# Patient Record
Sex: Male | Born: 1960
Health system: Southern US, Community
[De-identification: ages and names within clinical notes are randomized; demographics above are authoritative.]

## PROBLEM LIST (undated history)

## (undated) DIAGNOSIS — I10 Essential (primary) hypertension: Secondary | ICD-10-CM

## (undated) HISTORY — PX: VASECTOMY: SHX75

## (undated) HISTORY — DX: Essential (primary) hypertension: I10

---

## 2011-06-14 ENCOUNTER — Ambulatory Visit (INDEPENDENT_AMBULATORY_CARE_PROVIDER_SITE_OTHER): Payer: BC Managed Care – PPO | Admitting: Family Medicine

## 2011-06-14 DIAGNOSIS — I10 Essential (primary) hypertension: Secondary | ICD-10-CM

## 2011-06-14 DIAGNOSIS — E782 Mixed hyperlipidemia: Secondary | ICD-10-CM

## 2011-10-24 ENCOUNTER — Other Ambulatory Visit: Payer: Self-pay | Admitting: Family Medicine

## 2011-10-29 ENCOUNTER — Telehealth: Payer: Self-pay

## 2011-10-29 NOTE — Telephone Encounter (Signed)
PT STATES HE USUALLY TAKE 2 LISINOPRIL A DAY AND HE IS OUT, THE PHARMACY HAD TRIED GETTING A REFILL FOR IT AND WAS DENIED, HE DOESN'T UNDERSTAND WHY SINCE HE SHOULD HAVE HAD ENOUGH UNTIL June. PLEASE  CALL [phone]    [facility] [address]

## 2011-10-30 MED ORDER — LISINOPRIL-HYDROCHLOROTHIAZIDE 20-12.5 MG PO TABS: 2.0000 | ORAL_TABLET | Freq: Every day | ORAL | Status: DC

## 2011-10-30 NOTE — Telephone Encounter (Signed)
LMOM THAT RX WAS SENT IN FOR 2 A DAY AND TO FOLLOW UP IN [place]

## 2011-10-30 NOTE — Telephone Encounter (Signed)
The Rx was wrote to take Lisiniopril 1 QD, the message was to be take 2 QD and he needs a refill for 1 month and then to follow-up in June.

## 2011-11-18 ENCOUNTER — Telehealth: Payer: Self-pay

## 2011-11-18 NOTE — Telephone Encounter (Signed)
PT STATES HE CANNOT GET HIS BP MEDS AFTER CALLING PHARMACY AND THE PHARMACY CONTACTING us??   BEST PHONE [phone] [facility] [address].

## 2011-11-19 ENCOUNTER — Telehealth: Payer: Self-pay

## 2011-11-19 MED ORDER — LISINOPRIL-HYDROCHLOROTHIAZIDE 20-12.5 MG PO TABS: 2.0000 | ORAL_TABLET | Freq: Every day | ORAL | Status: DC

## 2011-11-19 NOTE — Telephone Encounter (Signed)
Pt states he did not receive 60 pills on may 15,  He is completely out and needs these today.

## 2011-11-19 NOTE — Telephone Encounter (Signed)
15 day supply rx'd today, LMOM advising patient that OV will be needed for more.

## 2011-11-20 ENCOUNTER — Telehealth: Payer: Self-pay

## 2011-11-20 NOTE — Telephone Encounter (Signed)
Patient states he is on his way to pharmacy to get Lisinopril. He has been out of BP meds for 5 days. He is mad because we refilled med until June 15th, but needs it refilled until June 28th. He has appt with Dr. Neva Seat on 6/28, which is 6 months to the day of his last appointment. Pt has to keep paying for refills, which is really starting to add up, when it shouldn't have to.  Note: Previous message from Steward Drone was not routed to any pool.

## 2011-11-20 NOTE — Telephone Encounter (Signed)
Pt notified that 1 month of rx was sent in

## 2011-11-20 NOTE — Telephone Encounter (Signed)
Patient called and voiced concerns that has transpired over receiving his blood pressure medication refills. His original prescription in December 2012 was for 1 q day of Lisinopril/HCTZ 20/12.5 when it should have been 2 q day. This in turn ran the patient short before his follow up visit for June 2013. His calls to the pharmacy and calls to the office were numerous and several mistakes occurred along the way. Mr Bostic finally received his medication as of this morning. He was given our sincere regrets over what has happened over this issue and how to proceed in the event he has these problems ever again. Patient was agreeable with the plan to ask to speak with a manager or myself. Will follow up with Dr Neva Seat on December 13 2011.

## 2011-12-13 ENCOUNTER — Ambulatory Visit (INDEPENDENT_AMBULATORY_CARE_PROVIDER_SITE_OTHER): Payer: PRIVATE HEALTH INSURANCE | Admitting: Family Medicine

## 2011-12-13 ENCOUNTER — Encounter: Payer: Self-pay | Admitting: Family Medicine

## 2011-12-13 VITALS — BP 149/85 | HR 73 | Temp 97.2°F | Resp 16 | Ht 71.25 in | Wt 223.0 lb

## 2011-12-13 DIAGNOSIS — E785 Hyperlipidemia, unspecified: Secondary | ICD-10-CM

## 2011-12-13 DIAGNOSIS — F17200 Nicotine dependence, unspecified, uncomplicated: Secondary | ICD-10-CM

## 2011-12-13 DIAGNOSIS — I10 Essential (primary) hypertension: Secondary | ICD-10-CM

## 2011-12-13 MED ORDER — LISINOPRIL-HYDROCHLOROTHIAZIDE 20-12.5 MG PO TABS: 2.0000 | ORAL_TABLET | Freq: Every day | ORAL | Status: DC

## 2011-12-13 MED ORDER — AMLODIPINE BESYLATE 2.5 MG PO TABS: 2.5000 mg | ORAL_TABLET | Freq: Every day | ORAL | Status: DC

## 2011-12-13 NOTE — Progress Notes (Signed)
  Subjective:    Patient ID: Lavalle Skoda, male    DOB: 03-18-1961, 51 y.o.   MRN: [id number]  HPI Reshard Guillet is a 51 y.o. male HTN-  Recently on meds, but had been out for a few days.  Phone notes reviewed regarding medication difficulties form December.  Should be on (2) lisinopril/hct 20/12.5mg  QD, and norvasc 2.5mg  QD. Working on weight - down 5 pounds since last office visit.  Alcohol 2 days per week. Still smoking but cut back to 1 pack per day.   Outside Bp's 130's/70's. Sometimes 120.   Hyperlipidemia - last lipids here in 2010. Ate lunch 40 minutes ago.  Fish oil and MVI QD.   Review of Systems  Constitutional: Negative for fatigue and unexpected weight change.  Eyes: Negative for visual disturbance.  Respiratory: Negative for cough, chest tightness and shortness of breath.   Cardiovascular: Negative for chest pain, palpitations and leg swelling.  Gastrointestinal: Negative for abdominal pain and blood in stool.  Musculoskeletal: Negative for myalgias.  Neurological: Negative for dizziness, light-headedness and headaches.       Objective:   Physical Exam  Constitutional: He is oriented to person, place, and time. He appears well-developed and well-nourished.  HENT:  Head: Normocephalic and atraumatic.  Eyes: EOM are normal. Pupils are equal, round, and reactive to light.  Neck: No JVD present. Carotid bruit is not present.  Cardiovascular: Normal rate, regular rhythm and normal heart sounds.   No murmur heard. Pulmonary/Chest: Effort normal and breath sounds normal. He has no rales.  Musculoskeletal: He exhibits no edema.  Neurological: He is alert and oriented to person, place, and time.  Skin: Skin is warm and dry.  Psychiatric: He has a normal mood and affect. His behavior is normal.          Assessment & Plan:  Dujuan Stankowski is a 51 y.o. male  HTN - borderline but home BP's ok. Continue to work on weight loss and recheck in 6 months.   Hyperlipidemia -  not fasting today - plan on fasting labs in 3 days.  Lab visit only.  Cont fish oil, consider low dose statin - discussed possible side effects, and rtc precautions, but with tobacco use (cessation discussed again, and congratulated on decrease in amount) and underlying htn, at risk for CAD/CVD.  Goal LDL under 100.   Patient checking on physical cost with new insurance - encouraged to schedule physical or office visit in next 6 months.

## 2012-06-12 ENCOUNTER — Ambulatory Visit: Payer: PRIVATE HEALTH INSURANCE | Admitting: Family Medicine

## 2012-06-15 ENCOUNTER — Ambulatory Visit: Payer: PRIVATE HEALTH INSURANCE | Admitting: Family Medicine

## 2012-07-13 ENCOUNTER — Other Ambulatory Visit: Payer: Self-pay | Admitting: Family Medicine

## 2012-07-13 NOTE — Telephone Encounter (Signed)
Needs office visit.

## 2012-07-20 ENCOUNTER — Encounter: Payer: PRIVATE HEALTH INSURANCE | Admitting: Family Medicine

## 2012-07-20 NOTE — Progress Notes (Signed)
  Subjective:    Patient ID: Jonathan Bush, male    DOB: July 14, 1960, 52 y.o.   MRN: [id number]  HPI HTN - borderline last ov, but home BP's ok (Outside Bp's 130's/70's). Continued on (2) lisinopril/hct 20/12.5mg  QD, and norvasc 2.5mg  QD. Advised to continue to work on weight loss and recheck in 6 months.   Hyperlipidemia - see last ov.  Plan for fasting labs with order, but not have these done.    Review of Systems     Objective:   Physical Exam        Assessment & Plan:   This encounter was created in error - please disregard.

## 2012-08-17 ENCOUNTER — Ambulatory Visit (INDEPENDENT_AMBULATORY_CARE_PROVIDER_SITE_OTHER): Payer: PRIVATE HEALTH INSURANCE | Admitting: Family Medicine

## 2012-08-17 ENCOUNTER — Encounter: Payer: Self-pay | Admitting: Family Medicine

## 2012-08-17 VITALS — BP 142/68 | HR 68 | Temp 97.8°F | Resp 16 | Ht 72.0 in | Wt 225.2 lb

## 2012-08-17 DIAGNOSIS — Z720 Tobacco use: Secondary | ICD-10-CM

## 2012-08-17 DIAGNOSIS — E785 Hyperlipidemia, unspecified: Secondary | ICD-10-CM

## 2012-08-17 DIAGNOSIS — F17200 Nicotine dependence, unspecified, uncomplicated: Secondary | ICD-10-CM

## 2012-08-17 DIAGNOSIS — I10 Essential (primary) hypertension: Secondary | ICD-10-CM

## 2012-08-17 MED ORDER — AMLODIPINE BESYLATE 2.5 MG PO TABS: ORAL_TABLET | ORAL | Status: DC

## 2012-08-17 MED ORDER — LISINOPRIL-HYDROCHLOROTHIAZIDE 20-12.5 MG PO TABS: ORAL_TABLET | ORAL | Status: DC

## 2012-08-17 NOTE — Progress Notes (Signed)
Subjective:    Patient ID: Jonathan Bush, male    DOB: 04-Feb-1961, 52 y.o.   MRN: [id number]  HPI Jonathan Bush is a 52 y.o. male  Last office visit June 2013.  Plan at that time was follow up for ov or CPE in 6 months.   HTN - borderline but home BP's ok. Continue to work on weight loss and recheck in 6 months. Outside blood pressures - 130/70's.   Hyperlipidemia - not fasting last ov.  - planned on fasting labs in 3 days at that time.  tobacco use, and underlying htn, at risk for CAD/CVD.  Goal LDL under 100. Taking fish oil every day. Had fasting blood work drawn this am.  Tobacco use - still at 1 ppd, thinking of quitting, planning on getting weight down first.  Etoh: 2 days per week - 6-8 beers at a time.   Cost prohibitive to schedule physical.   Father deceased at 71yo  New dx of lung cancer - smoker.  no known fh of early CAD.    Review of Systems  Constitutional: Negative for fatigue and unexpected weight change.  Eyes: Negative for visual disturbance.  Respiratory: Negative for cough, chest tightness and shortness of breath.   Cardiovascular: Negative for chest pain, palpitations and leg swelling.  Gastrointestinal: Negative for abdominal pain and blood in stool.  Neurological: Negative for dizziness, light-headedness and headaches.      Objective:   Physical Exam  Vitals reviewed. Constitutional: He is oriented to person, place, and time. He appears well-developed and well-nourished.  HENT:  Head: Normocephalic and atraumatic.  Eyes: EOM are normal. Pupils are equal, round, and reactive to light.  Neck: No JVD present. Carotid bruit is not present.  Cardiovascular: Normal rate, regular rhythm and normal heart sounds.   No murmur heard. Pulmonary/Chest: Effort normal and breath sounds normal. He has no rales.  Musculoskeletal: He exhibits no edema.  Neurological: He is alert and oriented to person, place, and time.  Skin: Skin is warm and dry.  Psychiatric: He has  a normal mood and affect.       Assessment & Plan:  Jonathan Bush is a 52 y.o. male HTN (hypertension) - Plan: amLODipine (NORVASC) 2.5 MG tablet, lisinopril-hydrochlorothiazide (PRINZIDE,ZESTORETIC) 20-12.5 MG per tablet, Comprehensive metabolic panel.  Continue same doses meds for now as reported better control out of office, but bring meter/outside readings to next ov. Also discussed alcohol use, and concern of 6-8 beer consumption, even if only 2 days per week.  Other and unspecified hyperlipidemia - Plan: Lipid panel.  May need statin.  Discussed his concerns with this medicine, but also concerns for CAD given rf's of tobacco use, age, and HTN.  Tobacco abuse - contemplative stage of cessation.  Discussed benefits of cessation and continued risks of tobacco abuse, especially in light of diagnosis of lung CA in father prior to his passing.  rtc if any new or worsening cough, understanding expressed.   Discussed scheduling CPE, but cost prohibitive at this point. Some anticipatory guidance as above.   Patient Instructions  Your should receive a call or letter about your lab results within the next week to 10 days.  Recheck in 6 months depending on labs.   If you need assistance with quitting smoking, let us know.  Return to the clinic or go to the nearest emergency room if any of your symptoms worsen or new symptoms occur.    Meds ordered this encounter  Medications  . amLODipine (  NORVASC) 2.5 MG tablet    Sig: take 1 tablet by mouth once daily    Dispense:  90 tablet    Refill:  1  . lisinopril-hydrochlorothiazide (PRINZIDE,ZESTORETIC) 20-12.5 MG per tablet    Sig: take 2 tablets by mouth daily    Dispense:  180 tablet    Refill:  1

## 2012-08-17 NOTE — Patient Instructions (Addendum)
Your should receive a call or letter about your lab results within the next week to 10 days.  Recheck in 6 months depending on labs.   If you need assistance with quitting smoking, let us know.  Return to the clinic or go to the nearest emergency room if any of your symptoms worsen or new symptoms occur.

## 2012-08-18 LAB — COMPREHENSIVE METABOLIC PANEL
AST: 17 U/L (ref 0–37)
Albumin: 4.1 g/dL (ref 3.5–5.2)
BUN: 15 mg/dL (ref 6–23)
CO2: 27 mEq/L (ref 19–32)
Calcium: 9.2 mg/dL (ref 8.4–10.5)
Chloride: 104 mEq/L (ref 96–112)
Creat: 0.92 mg/dL (ref 0.50–1.35)
Glucose, Bld: 113 mg/dL — ABNORMAL HIGH (ref 70–99)
Potassium: 4.2 mEq/L (ref 3.5–5.3)

## 2012-08-18 LAB — LIPID PANEL
Cholesterol: 184 mg/dL (ref 0–200)
HDL: 30 mg/dL — ABNORMAL LOW (ref >39)
Triglycerides: 252 mg/dL — ABNORMAL HIGH (ref <150)

## 2013-02-22 ENCOUNTER — Ambulatory Visit: Payer: PRIVATE HEALTH INSURANCE | Admitting: Family Medicine

## 2013-03-13 ENCOUNTER — Other Ambulatory Visit: Payer: Self-pay | Admitting: Family Medicine

## 2013-03-22 ENCOUNTER — Ambulatory Visit: Payer: PRIVATE HEALTH INSURANCE | Admitting: Family Medicine

## 2013-04-19 ENCOUNTER — Ambulatory Visit: Payer: PRIVATE HEALTH INSURANCE | Admitting: Family Medicine

## 2013-05-17 ENCOUNTER — Ambulatory Visit: Payer: PRIVATE HEALTH INSURANCE | Admitting: Family Medicine

## 2013-09-29 ENCOUNTER — Telehealth: Payer: Self-pay

## 2013-09-29 MED ORDER — LISINOPRIL-HYDROCHLOROTHIAZIDE 20-12.5 MG PO TABS: 2.0000 | ORAL_TABLET | Freq: Every day | ORAL | Status: DC

## 2013-09-29 NOTE — Telephone Encounter (Signed)
Done . Pt notified.  

## 2013-09-29 NOTE — Telephone Encounter (Signed)
Pt would like a rx refill on his lisinopril, he would also like to add that he was unable to get an appt until June 15 with Dr.Greene. Best# [phone]

## 2013-10-04 ENCOUNTER — Other Ambulatory Visit: Payer: Self-pay | Admitting: Physician Assistant

## 2013-11-29 ENCOUNTER — Ambulatory Visit: Payer: PRIVATE HEALTH INSURANCE | Admitting: Family Medicine

## 2013-12-05 ENCOUNTER — Other Ambulatory Visit: Payer: Self-pay | Admitting: Family Medicine

## 2014-02-12 ENCOUNTER — Other Ambulatory Visit: Payer: Self-pay | Admitting: Physician Assistant

## 2014-04-11 ENCOUNTER — Other Ambulatory Visit: Payer: Self-pay | Admitting: Family Medicine

## 2014-04-15 ENCOUNTER — Ambulatory Visit (INDEPENDENT_AMBULATORY_CARE_PROVIDER_SITE_OTHER): Payer: PRIVATE HEALTH INSURANCE | Admitting: Family Medicine

## 2014-04-15 VITALS — BP 128/68 | HR 81 | Temp 97.9°F | Resp 16 | Ht 73.0 in | Wt 223.0 lb

## 2014-04-15 DIAGNOSIS — I10 Essential (primary) hypertension: Secondary | ICD-10-CM

## 2014-04-15 DIAGNOSIS — E785 Hyperlipidemia, unspecified: Secondary | ICD-10-CM

## 2014-04-15 DIAGNOSIS — R739 Hyperglycemia, unspecified: Secondary | ICD-10-CM

## 2014-04-15 LAB — COMPREHENSIVE METABOLIC PANEL
ALK PHOS: 75 U/L (ref 39–117)
ALT: 15 U/L (ref 0–53)
AST: 23 U/L (ref 0–37)
Albumin: 4.4 g/dL (ref 3.5–5.2)
BILIRUBIN TOTAL: 0.5 mg/dL (ref 0.2–1.2)
BUN: 10 mg/dL (ref 6–23)
CO2: 29 mEq/L (ref 19–32)
CREATININE: 0.91 mg/dL (ref 0.50–1.35)
Calcium: 9.7 mg/dL (ref 8.4–10.5)
Chloride: 102 mEq/L (ref 96–112)
GLUCOSE: 104 mg/dL — AB (ref 70–99)
Potassium: 4.5 mEq/L (ref 3.5–5.3)
SODIUM: 138 meq/L (ref 135–145)
TOTAL PROTEIN: 7.3 g/dL (ref 6.0–8.3)

## 2014-04-15 LAB — LIPID PANEL
CHOL/HDL RATIO: 6.2 ratio
Cholesterol: 185 mg/dL (ref 0–200)
HDL: 30 mg/dL — AB (ref >39)
LDL CALC: 128 mg/dL — AB (ref 0–99)
TRIGLYCERIDES: 136 mg/dL (ref <150)
VLDL: 27 mg/dL (ref 0–40)

## 2014-04-15 LAB — POCT GLYCOSYLATED HEMOGLOBIN (HGB A1C): Hemoglobin A1C: 4.7

## 2014-04-15 LAB — GLUCOSE, POCT (MANUAL RESULT ENTRY): POC Glucose: 104 mg/dl — AB (ref 70–99)

## 2014-04-15 MED ORDER — AMLODIPINE BESYLATE 2.5 MG PO TABS: 2.5000 mg | ORAL_TABLET | Freq: Every day | ORAL | Status: DC

## 2014-04-15 MED ORDER — LISINOPRIL-HYDROCHLOROTHIAZIDE 20-12.5 MG PO TABS: 2.0000 | ORAL_TABLET | Freq: Every day | ORAL | Status: DC

## 2014-04-15 NOTE — Progress Notes (Addendum)
Subjective:    Patient ID: Jonathan Bush, male    DOB: 07-31-1960, 53 y.o.   MRN: [id number]  This chart was scribed for Meredith StaggersJeffrey Rosemary Pentecost, MD by Tonye RoyaltyJoshua Chen, ED Scribe. This patient was seen in room 3 and the patient's care was started at 9:25 AM.   Chief Complaint  Patient presents with  . Medication Refill    HPI  HPI Comments: Jonathan RiggMichael Freeburg is a 53 y.o. male who presents to the [facility] for medication refill.   Hypertension: His last visit with me was in March of 2014. He did have 4 canceled appointments and 1 no-show since that time. He has been given refills of medication recently with notes that office visit was needed for additional refills. At last visit, weight was 225 lb and BP 142/68. Continued same doses of medications and weight loss along with decreasing alcohol has had times when he would drink 6-8 beers at one time, but only a few days per week. He states he has been checking his blood pressure at home and has been getting 1302/70s, it measures here at 126/68. He denies chest pain, lightheadedness, or dizziness. He also denies side effects from medication and states he is compliant. He states he has never taken ASA every day.  Hyperlipidemia: LDL 104, Lipids slightly elevated at last visit.  Lab Results  Component Value Date   CHOL 184 08/17/2012   HDL 30* 08/17/2012   LDLCALC 104* 08/17/2012   TRIG 252* 08/17/2012   CHOLHDL 6.1 08/17/2012  Recommended weight loss and recheck in 6 months. Discussed Statin at last visit, especially with his risk factors, but he declined at that time. Still taking fish oil OTC.   Tobacco Abuse: Counseled him on cessation multiple times in the past. He has roughly a 30 year history. He was in the contemplative stage prior; his father was diagnosed with lung cancer, and he was deceased at 373. He states he quit for almost 4 months during the winter but started back up due to stress. He states he has cut back some and is going to try  quitting again in 3 days. He states he just quit cold [place] last time and is planning to do the same this time. He states he has friends who have used Chantix, discussed with him this option.  Hyperglycemia: His blood sugar was 113 in March of 2013. Weight was 225 at that visit, he is 223 today. He denies increased thirst, nausea, or increased urination. He states he is fasting since 2000 last night.  Health Maintenance: Has not had colonoscopy. Plans on deferring prostate screening until physical as well.   There are no active problems to display for this patient.  Past Medical History  Diagnosis Date  . Hypertension    Past Surgical History  Procedure Laterality Date  . Vasectomy     No Known Allergies Prior to Admission medications   Medication Sig Start Date End Date Taking? Authorizing Provider  amLODipine (NORVASC) 2.5 MG tablet TAKE 1 TABLET BY MOUTH ONCE DAILY.   2ND NOTICE OFFICE VISIT NEEDED FOR ADDITIONAL REFILLS" 04/11/14  Yes Chelle S Jeffery, PA-C  fish oil-omega-3 fatty acids 1000 MG capsule Take 2 g by mouth daily.   Yes Historical Provider, MD  lisinopril-hydrochlorothiazide (PRINZIDE,ZESTORETIC) 20-12.5 MG per tablet Take 2 tablets by mouth daily. PATIENT NEEDS AN OFFICE VISIT FOR ADDITIONAL REFILLS. 02/13/14  Yes Shade FloodJeffrey R Tilak Oakley, MD  Multiple Vitamin (MULTIVITAMIN) tablet Take 1 tablet by  mouth daily.   Yes Historical Provider, MD   History   Social History  . Marital Status: Unknown    Spouse Name: N/A    Number of Children: N/A  . Years of Education: N/A   Occupational History  . Sales    Social History Main Topics  . Smoking status: Current Every Day Smoker -- 1.00 packs/day for 30 years    Types: Cigarettes  . Smokeless tobacco: Not on file  . Alcohol Use: Yes     Comment: beer  . Drug Use: No  . Sexual Activity: Yes    Partners: Female   Other Topics Concern  . Not on file   Social History Narrative   Patient walks for exercise.    Review  of Systems  Cardiovascular: Negative for chest pain.  Gastrointestinal: Negative for nausea.  Endocrine: Negative for polydipsia and polyuria.  Neurological: Negative for dizziness and light-headedness.       Objective:   Physical Exam  Nursing note and vitals reviewed. Constitutional: He is oriented to person, place, and time. He appears well-developed and well-nourished.  HENT:  Head: Normocephalic and atraumatic.  Eyes: Conjunctivae are normal.  Neck: Normal range of motion. Neck supple.  Cardiovascular: Normal rate, regular rhythm and normal heart sounds.   No murmur heard. Pulmonary/Chest: Effort normal and breath sounds normal. No respiratory distress. He has no wheezes. He has no rales.  Abdominal: Soft. Bowel sounds are normal. He exhibits no distension. There is no tenderness. There is no rebound and no guarding.  Musculoskeletal: Normal range of motion.  Neurological: He is alert and oriented to person, place, and time.  Skin: Skin is warm and dry.  Psychiatric: He has a normal mood and affect.    Filed Vitals:   04/15/14 0830  BP: 128/68  Pulse: 81  Temp: 97.9 F (36.6 C)  TempSrc: Oral  Resp: 16  Height: 6\' 1"  (1.854 m)  Weight: 223 lb (101.152 kg)  SpO2: 96%   Results for orders placed in visit on 04/15/14  GLUCOSE, POCT (MANUAL RESULT ENTRY)      Result Value Ref Range   POC Glucose 104 (*) 70 - 99 mg/dl  POCT GLYCOSYLATED HEMOGLOBIN (HGB A1C)      Result Value Ref Range   Hemoglobin A1C 4.7         Assessment & Plan:  Jonathan RiggMichael Ardolino is a 53 y.o. male Essential hypertension - Plan: Comprehensive metabolic panel  -controlled. No med changes. Wt loss, diet discussed.   Hyperlipidemia - Plan: Comprehensive metabolic panel, Lipid panel  -10 year ASCVD risk based on prior lipid panel and today's BP was 14%.  Recommended low dose statin trial, but can wait on current lipid panel to further decide. If quits smoking - risk drops to 6.7%.  Hyperglycemia -  Plan: POCT glucose (manual entry), POCT glycosylated hemoglobin (Hb A1C), Comprehensive metabolic panel  - A1C ok. Wt loss, diet and exercise discussed.  Health maintenance  -declined flu vaccine, declined referral for colonoscopy or prostate cancer testing today. Plans on follow up for physical to discuss these in next 6 months.   Tobacco abuse - plans on quit attempt again next week - resources discussed if needed.  No orders of the defined types were placed in this encounter.   Patient Instructions  [place] offers smoking cessation clinics. Registration is required. To register call [phone] or register online at [email or web address]. Let me know if you need assistance with this or medication like Chantix.  You should receive a call or letter about your lab results within the next week to 10 days.  Work on diet, exercise for weight loss. Physical in next 6 months.  Return to the clinic or go to the nearest emergency room if any of your symptoms worsen or new symptoms occur.   I personally performed the services described in this documentation, which was scribed in my presence. The recorded information has been reviewed and considered, and addended by me as needed.

## 2014-04-15 NOTE — Patient Instructions (Addendum)
[place] offers smoking cessation clinics. Registration is required. To register call [phone] or register online at [email or web address]. Let me know if you need assistance with this or medication like Chantix. You should receive a call or letter about your lab results within the next week to 10 days.  Work on diet, exercise for weight loss. Physical in next 6 months.  Return to the clinic or go to the nearest emergency room if any of your symptoms worsen or new symptoms occur.

## 2014-04-19 ENCOUNTER — Telehealth: Payer: Self-pay | Admitting: Family Medicine

## 2014-04-19 NOTE — Telephone Encounter (Signed)
Pt will call back in about 3 months to set up for CPE appt with Dr. Neva SeatGreene due to scheduling issues with his job.

## 2014-04-19 NOTE — Telephone Encounter (Signed)
-----   Message from Shade FloodJeffrey R Greene, MD sent at 04/15/2014  9:42 AM EDT ----- Physical with Neva SeatGreene in 6 months.

## 2014-04-27 ENCOUNTER — Telehealth: Payer: Self-pay | Admitting: *Deleted

## 2014-04-27 DIAGNOSIS — E785 Hyperlipidemia, unspecified: Secondary | ICD-10-CM

## 2014-04-27 NOTE — Telephone Encounter (Signed)
Pt would like to try a statin.

## 2014-04-27 NOTE — Telephone Encounter (Signed)
-----   Message from Shade FloodJeffrey R Greene, MD sent at 04/27/2014 12:12 AM EST ----- Call patient.  Blood sugar still borderline high, but 3 month test ok last visit. Other electrolytes in normal range. His cholesterol is still elevated and somewhat higher than last check. I would recommend low dose statin trial - let me know if he is ok with this, and I will send one in. Let me know if he has questions about this.

## 2014-04-28 MED ORDER — PRAVASTATIN SODIUM 20 MG PO TABS: 20.0000 mg | ORAL_TABLET | Freq: Every day | ORAL | Status: DC

## 2014-04-28 NOTE — Telephone Encounter (Signed)
Sent in pravachol Rx, low dose. Recheck in next 6-8 weeks for repeat CMP to make sure LFT's are ok and lipids. If any new myalgias, or other side effects before then - RTC.

## 2014-04-28 NOTE — Telephone Encounter (Signed)
Pt advised. He understands to schedule an appt or come to the walk in clinic in 6-8 weeks.

## 2014-10-20 ENCOUNTER — Other Ambulatory Visit: Payer: Self-pay | Admitting: Family Medicine

## 2014-11-25 ENCOUNTER — Other Ambulatory Visit: Payer: Self-pay | Admitting: Family Medicine

## 2014-12-05 ENCOUNTER — Other Ambulatory Visit: Payer: Self-pay | Admitting: Physician Assistant

## 2015-01-13 ENCOUNTER — Other Ambulatory Visit: Payer: Self-pay | Admitting: Physician Assistant

## 2015-01-13 NOTE — Telephone Encounter (Signed)
Patient has an appointment with Dr. Neva Seat on August 22nd and is request a short term supply of lisinopril to last until his appointment. Pharmacy is [facility] on [address] He also wanted to let Dr. Neva Seat know that his medication are out of wack because he has two months of the dieretic left and is almost out of lisinopril

## 2015-01-14 NOTE — Telephone Encounter (Signed)
Notified pt. 

## 2015-02-06 ENCOUNTER — Encounter: Payer: Self-pay | Admitting: Family Medicine

## 2015-02-06 ENCOUNTER — Ambulatory Visit (INDEPENDENT_AMBULATORY_CARE_PROVIDER_SITE_OTHER): Payer: PRIVATE HEALTH INSURANCE | Admitting: Family Medicine

## 2015-02-06 VITALS — BP 160/84 | HR 74 | Temp 98.4°F | Resp 16 | Ht 73.0 in | Wt 227.4 lb

## 2015-02-06 DIAGNOSIS — E785 Hyperlipidemia, unspecified: Secondary | ICD-10-CM | POA: Diagnosis not present

## 2015-02-06 DIAGNOSIS — R739 Hyperglycemia, unspecified: Secondary | ICD-10-CM

## 2015-02-06 DIAGNOSIS — I10 Essential (primary) hypertension: Secondary | ICD-10-CM

## 2015-02-06 DIAGNOSIS — Z720 Tobacco use: Secondary | ICD-10-CM | POA: Diagnosis not present

## 2015-02-06 LAB — COMPLETE METABOLIC PANEL WITH GFR
ALT: 13 U/L (ref 9–46)
AST: 21 U/L (ref 10–35)
Albumin: 4.1 g/dL (ref 3.6–5.1)
Alkaline Phosphatase: 62 U/L (ref 40–115)
BUN: 18 mg/dL (ref 7–25)
CO2: 26 mmol/L (ref 20–31)
Calcium: 9 mg/dL (ref 8.6–10.3)
Chloride: 106 mmol/L (ref 98–110)
Creat: 0.87 mg/dL (ref 0.70–1.33)
GFR, Est African American: >89 mL/min (ref >=60)
GFR, Est Non African American: >89 mL/min (ref >=60)
GLUCOSE: 114 mg/dL — AB (ref 65–99)
POTASSIUM: 4.4 mmol/L (ref 3.5–5.3)
SODIUM: 138 mmol/L (ref 135–146)
Total Bilirubin: 0.3 mg/dL (ref 0.2–1.2)
Total Protein: 7 g/dL (ref 6.1–8.1)

## 2015-02-06 LAB — LIPID PANEL
Cholesterol: 188 mg/dL (ref 125–200)
HDL: 24 mg/dL — AB (ref >=40)
LDL CALC: 100 mg/dL (ref <130)
TRIGLYCERIDES: 319 mg/dL — AB (ref <150)
Total CHOL/HDL Ratio: 7.8 Ratio — ABNORMAL HIGH (ref <=5.0)
VLDL: 64 mg/dL — AB (ref <30)

## 2015-02-06 LAB — HEMOGLOBIN A1C
Hgb A1c MFr Bld: 5.5 % (ref <5.7)
Mean Plasma Glucose: 111 mg/dL (ref <117)

## 2015-02-06 MED ORDER — LISINOPRIL-HYDROCHLOROTHIAZIDE 20-12.5 MG PO TABS: ORAL_TABLET | ORAL | Status: DC

## 2015-02-06 MED ORDER — AMLODIPINE BESYLATE 2.5 MG PO TABS: 2.5000 mg | ORAL_TABLET | Freq: Every day | ORAL | Status: DC

## 2015-02-06 NOTE — Patient Instructions (Addendum)
You should receive a call or letter about your lab results within the next week to 10 days. If cholesterol elevated or blood sugar elevated - we can talk about options.  [place] offers smoking cessation clinics. Registration is required. To register call [phone] or register online at [email or web address]. Call me if you would like to try Chantix.   Plan on physical in 6 months.

## 2015-02-06 NOTE — Progress Notes (Signed)
Subjective:  This chart was scribed for Jonathan Staggers, MD by Jonathan Bush, ED Scribe. This patient was seen in room 25 and the patient's care was started at 4:52 PM.  Patient ID: Jonathan Bush, male    DOB: November 17, 1960, 54 y.o.   MRN: [id number]  HPI   Chief Complaint  Patient presents with  . Medication Refill   HPI Comments: Jonathan Bush is a 54 y.o. male who presents to the [facility] for a follow up   Hypertension Last visit with me was 03/2014. States he checks BP outside of office which run 130/60-70's. He denies CP, difficulty breathing, light headedness and dizziness.   Lab Results  Component Value Date   CREATININE 0.91 04/15/2014   Hyperglycemia Reccommended 3 month follow up. Had normal a1c of 4.7.  Hyperlipidemia  Lab Results  Component Value Date   CHOL 185 04/15/2014   HDL 30* 04/15/2014   LDLCALC 128* 04/15/2014   TRIG 136 04/15/2014   CHOLHDL 6.2 04/15/2014   His is on omega 3 fatty acid. Pt never started pravastatin but is will to start medication.   Wt Readings from Last 3 Encounters:  02/06/15 227 lb 6.4 oz (103.148 kg)  04/15/14 223 lb (101.152 kg)  08/17/12 225 lb 3.2 oz (102.15 kg)   Pt has not been exercsises but plans to start walking when its start walking when the weather cools down.  Dental pain Pt reports he has dental pain. He was evaluated by dentist 2 weeks ago and was   Tobacco Abuse  Pt smokes about pack a day. Pt plans to quit, cold [place], after returning from [facility] in January. He was given a prescriptions of Chantix in the past but no loner has prescription.  There are no active problems to display for this patient.  Past Medical History  Diagnosis Date  . Hypertension    Past Surgical History  Procedure Laterality Date  . Vasectomy     No Known Allergies Prior to Admission medications   Medication Sig Start Date End Date Taking? Authorizing Provider  amLODipine (NORVASC) 2.5 MG tablet take 1  tablet by mouth once daily 11/25/14  Yes Jonathan Riddle, PA-C  fish oil-omega-3 fatty acids 1000 MG capsule Take 2 g by mouth daily.   Yes Historical Provider, MD  lisinopril-hydrochlorothiazide (PRINZIDE,ZESTORETIC) 20-12.5 MG per tablet TAKE 2 TABLETS DAILY 01/14/15  Yes Jonathan Jeffery, PA-C  Multiple Vitamin (MULTIVITAMIN) tablet Take 1 tablet by mouth daily.   Yes Historical Provider, MD   Social History   Social History  . Marital Status: Unknown    Spouse Name: N/A  . Number of Children: N/A  . Years of Education: N/A   Occupational History  . Sales    Social History Main Topics  . Smoking status: Current Every Day Smoker -- 1.00 packs/day for 30 years    Types: Cigarettes  . Smokeless tobacco: Not on file  . Alcohol Use: Yes     Comment: beer  . Drug Use: No  . Sexual Activity:    Partners: Female   Other Topics Concern  . Not on file   Social History Narrative   Patient walks for exercise.   Review of Systems  Constitutional: Negative for fatigue and unexpected weight change.  Eyes: Negative for visual disturbance.  Respiratory: Negative for cough, chest tightness and shortness of breath.   Cardiovascular: Negative for chest pain, palpitations and leg swelling.  Gastrointestinal: Negative for abdominal pain and  blood in stool.  Neurological: Negative for dizziness, light-headedness and headaches.   Objective:   Physical Exam  Constitutional: He is oriented to person, place, and time. He appears well-developed and well-nourished.  HENT:  Head: Normocephalic and atraumatic.  Eyes: EOM are normal. Pupils are equal, round, and reactive to light.  Neck: No JVD present. Carotid bruit is not present.  Cardiovascular: Normal rate, regular rhythm and normal heart sounds.   No murmur heard. Pulmonary/Chest: Effort normal and breath sounds normal. He has no rales.  Musculoskeletal: He exhibits no edema.  Neurological: He is alert and oriented to person, place, and time.    Skin: Skin is warm and dry.  Psychiatric: He has a normal mood and affect.  Vitals reviewed.  Filed Vitals:   02/06/15 1622  BP: 160/84  Pulse: 74  Temp: 98.4 F (36.9 C)  TempSrc: Oral  Resp: 16  Height: 6\' 1"  (1.854 m)  Weight: 227 lb 6.4 oz (103.148 kg)  SpO2: 97%   Assessment & Plan:   Jonathan Bush is a 54 y.o. male Hyperlipidemia - Plan: Lipid panel  -Up panel pending. Only on over-the-counter fish oil this point. With smoking and hypertension and age, suspect he will meet criteria for statin, but lipids pending.  Essential hypertension - Plan: lisinopril-hydrochlorothiazide (PRINZIDE,ZESTORETIC) 20-12.5 MG per tablet, amLODipine (NORVASC) 2.5 MG tablet, COMPLETE METABOLIC PANEL WITH GFR, Lipid panel  -Controlled outside of office, slightly elevated today, but also in pain due to dental issue. Monitor home blood pressures, and if remaining over 140 or 90, call to discuss change in regimen. RTC/ER precautions.  Hyperglycemia - Plan: Hemoglobin A1c  A1c pending. Discussed weight loss with exercise and diet change.  Tobacco abuse  -Plans on quitting after her upcoming vacation. Discussed Chantix, including risks and benefits and possible side effects, so could prescribe this for him without office visit if he decides this is one he wants to use. Also recommended smoking cessation clinic at [facility].  Meds ordered this encounter  Medications  . lisinopril-hydrochlorothiazide (PRINZIDE,ZESTORETIC) 20-12.5 MG per tablet    Sig: TAKE 2 TABLETS DAILY    Dispense:  180 tablet    Refill:  1  . amLODipine (NORVASC) 2.5 MG tablet    Sig: Take 1 tablet (2.5 mg total) by mouth daily.    Dispense:  90 tablet    Refill:  1   Patient Instructions  You should receive a call or letter about your lab results within the next week to 10 days. If cholesterol elevated or blood sugar elevated - we can talk about options.   offers smoking cessation clinics. Registration is  required. To register call [phone] or register online at [email or web address]. Call me if you would like to try Chantix.   Plan on physical in 6 months.     I personally performed the services described in this documentation, which was scribed in my presence. The recorded information has been reviewed and considered, and addended by me as needed.

## 2015-02-15 ENCOUNTER — Encounter: Payer: Self-pay | Admitting: Family Medicine

## 2015-02-15 ENCOUNTER — Other Ambulatory Visit: Payer: Self-pay | Admitting: Family Medicine

## 2015-02-15 DIAGNOSIS — E785 Hyperlipidemia, unspecified: Secondary | ICD-10-CM

## 2015-02-15 MED ORDER — PRAVASTATIN SODIUM 20 MG PO TABS: 20.0000 mg | ORAL_TABLET | Freq: Every day | ORAL | Status: DC

## 2015-04-07 ENCOUNTER — Telehealth: Payer: Self-pay

## 2015-04-07 DIAGNOSIS — I10 Essential (primary) hypertension: Secondary | ICD-10-CM

## 2015-04-07 MED ORDER — AMLODIPINE BESYLATE 2.5 MG PO TABS: 2.5000 mg | ORAL_TABLET | Freq: Every day | ORAL | Status: DC

## 2015-04-07 NOTE — Telephone Encounter (Signed)
Pt would like a refill on his amLODipine (NORVASC) 2.5 MG tablet [[id number]][[id number]]. He is at work and thinks this is the right script. Please advise [phone]

## 2015-04-07 NOTE — Telephone Encounter (Signed)
Rx sent 

## 2015-08-14 ENCOUNTER — Ambulatory Visit: Payer: PRIVATE HEALTH INSURANCE | Admitting: Family Medicine

## 2015-08-14 ENCOUNTER — Encounter: Payer: Self-pay | Admitting: Family Medicine

## 2015-08-16 ENCOUNTER — Ambulatory Visit: Payer: PRIVATE HEALTH INSURANCE | Admitting: Family Medicine

## 2015-08-27 ENCOUNTER — Other Ambulatory Visit: Payer: Self-pay | Admitting: Family Medicine

## 2015-09-20 ENCOUNTER — Ambulatory Visit: Payer: PRIVATE HEALTH INSURANCE | Admitting: Family Medicine

## 2015-11-09 ENCOUNTER — Other Ambulatory Visit: Payer: Self-pay

## 2015-11-09 NOTE — Telephone Encounter (Signed)
Pt has 30 days left of BP medication. He has scheduled the earliest appointment with Neva SeatGreene July 13 at 4:00. He was wondering if he can get this refilled when he runs out before his appointment.  Please advise  [phone]

## 2015-11-10 MED ORDER — LISINOPRIL-HYDROCHLOROTHIAZIDE 20-12.5 MG PO TABS: 2.0000 | ORAL_TABLET | Freq: Every day | ORAL | Status: DC

## 2015-11-10 NOTE — Telephone Encounter (Signed)
Yes. I sent in refills through July. Make sure he keeps that appointment.

## 2015-11-14 NOTE — Telephone Encounter (Signed)
Called and advised pt of RFs and need to keep appt. Pt agreed.

## 2015-12-28 ENCOUNTER — Ambulatory Visit (INDEPENDENT_AMBULATORY_CARE_PROVIDER_SITE_OTHER): Payer: Managed Care, Other (non HMO) | Admitting: Family Medicine

## 2015-12-28 ENCOUNTER — Encounter: Payer: Self-pay | Admitting: Family Medicine

## 2015-12-28 VITALS — BP 126/80 | HR 98 | Temp 98.5°F | Resp 16 | Ht 73.0 in | Wt 229.0 lb

## 2015-12-28 DIAGNOSIS — H6122 Impacted cerumen, left ear: Secondary | ICD-10-CM | POA: Diagnosis not present

## 2015-12-28 DIAGNOSIS — E785 Hyperlipidemia, unspecified: Secondary | ICD-10-CM | POA: Diagnosis not present

## 2015-12-28 DIAGNOSIS — I10 Essential (primary) hypertension: Secondary | ICD-10-CM | POA: Diagnosis not present

## 2015-12-28 DIAGNOSIS — Z1159 Encounter for screening for other viral diseases: Secondary | ICD-10-CM | POA: Diagnosis not present

## 2015-12-28 DIAGNOSIS — Z720 Tobacco use: Secondary | ICD-10-CM

## 2015-12-28 DIAGNOSIS — Z114 Encounter for screening for human immunodeficiency virus [HIV]: Secondary | ICD-10-CM | POA: Diagnosis not present

## 2015-12-28 DIAGNOSIS — R739 Hyperglycemia, unspecified: Secondary | ICD-10-CM

## 2015-12-28 MED ORDER — LISINOPRIL-HYDROCHLOROTHIAZIDE 20-12.5 MG PO TABS: 2.0000 | ORAL_TABLET | Freq: Every day | ORAL | Status: DC

## 2015-12-28 MED ORDER — AMLODIPINE BESYLATE 2.5 MG PO TABS: 2.5000 mg | ORAL_TABLET | Freq: Every day | ORAL | Status: DC

## 2015-12-28 NOTE — Patient Instructions (Addendum)
IF you received an x-ray today, you will receive an invoice from [facility]. Please contact [facility] at [phone] with questions or concerns regarding your invoice.   IF you received labwork today, you will receive an invoice from [employer]. Please contact [facility] at [phone] with questions or concerns regarding your invoice.   Our billing staff will not be able to assist you with questions regarding bills from these companies.  You will be contacted with the lab results as soon as they are available. The fastest way to get your results is to activate your My Chart account. Instructions are located on the last page of this paperwork. If you have not heard from [place] regarding the results in 2 weeks, please contact this office.    Return next week for fasting blood work. We can discuss options for cholesterol if needed at that time. No changes in medications for now. Follow-up with me in the next 6 months for a physical. If any difficulty with quitting smoking, let me know if I can help. See resources below.  [place] offers smoking cessation clinics. Registration is required. To register call [phone] or register online at [email or web address].  Smoking Cessation, Tips for Success If you are ready to quit smoking, congratulations! You have chosen to help yourself be healthier. Cigarettes bring nicotine, tar, carbon monoxide, and other irritants into your body. Your lungs, heart, and blood vessels will be able to work better without these poisons. There are many different ways to quit smoking. Nicotine gum, nicotine patches, a nicotine inhaler, or nicotine nasal spray can help with physical craving. Hypnosis, support groups, and medicines help break the habit of smoking. WHAT THINGS CAN I DO TO MAKE QUITTING EASIER?  Here are some tips to help you quit for good:  Pick a date when you will quit smoking completely. Tell all of your  friends and family about your plan to quit on that date.  Do not try to slowly cut down on the number of cigarettes you are smoking. Pick a quit date and quit smoking completely starting on that day.  Throw away all cigarettes.   Clean and remove all ashtrays from your home, work, and car.  On a card, write down your reasons for quitting. Carry the card with you and read it when you get the urge to smoke.  Cleanse your body of nicotine. Drink enough water and fluids to keep your urine clear or pale yellow. Do this after quitting to flush the nicotine from your body.  Learn to predict your moods. Do not let a bad situation be your excuse to have a cigarette. Some situations in your life might tempt you into wanting a cigarette.  Never have "just one" cigarette. It leads to wanting another and another. Remind yourself of your decision to quit.  Change habits associated with smoking. If you smoked while driving or when feeling stressed, try other activities to replace smoking. Stand up when drinking your coffee. Brush your teeth after eating. Sit in a different chair when you read the paper. Avoid alcohol while trying to quit, and try to drink fewer caffeinated beverages. Alcohol and caffeine may urge you to smoke.  Avoid foods and drinks that can trigger a desire to smoke, such as sugary or spicy foods and alcohol.  Ask people who smoke not to smoke around you.  Have something planned to do right after eating or having a cup of coffee. For example, plan  to take a walk or exercise.  Try a relaxation exercise to calm you down and decrease your stress. Remember, you may be tense and nervous for the first 2 weeks after you quit, but this will pass.  Find new activities to keep your hands busy. Play with a pen, coin, or rubber band. Doodle or draw things on paper.  Brush your teeth right after eating. This will help cut down on the craving for the taste of tobacco after meals. You can also try  mouthwash.   Use oral substitutes in place of cigarettes. Try using lemon drops, carrots, cinnamon sticks, or chewing gum. Keep them handy so they are available when you have the urge to smoke.  When you have the urge to smoke, try deep breathing.  Designate your home as a nonsmoking area.  If you are a heavy smoker, ask your health care provider about a prescription for nicotine chewing gum. It can ease your withdrawal from nicotine.  Reward yourself. Set aside the cigarette money you save and buy yourself something nice.  Look for support from others. Join a support group or smoking cessation program. Ask someone at home or at work to help you with your plan to quit smoking.  Always ask yourself, "Do I need this cigarette or is this just a reflex?" Tell yourself, "Today, I choose not to smoke," or "I do not want to smoke." You are reminding yourself of your decision to quit.  Do not replace cigarette smoking with electronic cigarettes (commonly called e-cigarettes). The safety of e-cigarettes is unknown, and some may contain harmful chemicals.  If you relapse, do not give up! Plan ahead and think about what you will do the next time you get the urge to smoke. HOW WILL I FEEL WHEN I QUIT SMOKING? You may have symptoms of withdrawal because your body is used to nicotine (the addictive substance in cigarettes). You may crave cigarettes, be irritable, feel very hungry, cough often, get headaches, or have difficulty concentrating. The withdrawal symptoms are only temporary. They are strongest when you first quit but will go away within 10-14 days. When withdrawal symptoms occur, stay in control. Think about your reasons for quitting. Remind yourself that these are signs that your body is healing and getting used to being without cigarettes. Remember that withdrawal symptoms are easier to treat than the major diseases that smoking can cause.  Even after the withdrawal is over, expect periodic urges  to smoke. However, these cravings are generally short lived and will go away whether you smoke or not. Do not smoke! WHAT RESOURCES ARE AVAILABLE TO HELP ME QUIT SMOKING? Your health care provider can direct you to community resources or hospitals for support, which may include:  Group support.  Education.  Hypnosis.  Therapy.   This information is not intended to replace advice given to you by your health care provider. Make sure you discuss any questions you have with your health care provider.   Document Released: 03/01/2004 Document Revised: 06/24/2014 Document Reviewed: 11/19/2012 Elsevier Interactive Patient Education 2016 Elsevier Inc.  Cerumen Impaction The structures of the external ear canal secrete a waxy substance known as cerumen. Excess cerumen can build up in the ear canal, causing a condition known as cerumen impaction. Cerumen impaction can cause ear pain and disrupt the function of the ear. The rate of cerumen production differs for each individual. In certain individuals, the configuration of the ear canal may decrease his or her ability to naturally remove  cerumen. CAUSES Cerumen impaction is caused by excessive cerumen production or buildup. RISK FACTORS  Frequent use of swabs to clean ears.  Having narrow ear canals.  Having eczema.  Being dehydrated. SIGNS AND SYMPTOMS  Diminished hearing.  Ear drainage.  Ear pain.  Ear itch. TREATMENT Treatment may involve:  Over-the-counter or prescription ear drops to soften the cerumen.  Removal of cerumen by a health care provider. This may be done with:  Irrigation with warm water. This is the most common method of removal.  Ear curettes and other instruments.  Surgery. This may be done in severe cases. HOME CARE INSTRUCTIONS  Take medicines only as directed by your health care provider.  Do not insert objects into the ear with the intent of cleaning the ear. PREVENTION  Do not insert objects  into the ear, even with the intent of cleaning the ear. Removing cerumen as a part of normal hygiene is not necessary, and the use of swabs in the ear canal is not recommended.  Drink enough water to keep your urine clear or pale yellow.  Control your eczema if you have it. SEEK MEDICAL CARE IF:  You develop ear pain.  You develop bleeding from the ear.  The cerumen does not clear after you use ear drops as directed.   This information is not intended to replace advice given to you by your health care provider. Make sure you discuss any questions you have with your health care provider.   Document Released: 07/11/2004 Document Revised: 06/24/2014 Document Reviewed: 01/18/2015 Elsevier Interactive Patient Education [employer]2016 [employer].

## 2015-12-28 NOTE — Progress Notes (Signed)
By signing my name below, I, [name], attest that this documentation has been prepared under the direction and in the presence of [name], MD.  Electronically Signed: Arvilla Market, Medical Scribe. 12/28/2015. 4:33 PM.  Subjective:    Patient ID: Jonathan Bush, male    DOB: 1960/10/25, 55 y.o.   MRN: [id number]  HPI Chief Complaint  Patient presents with  . Medication Refill    norvasc,lisinopril-hydrochlorothiazide    HPI Comments: Jonathan Bush is a 55 y.o. male who presents to the [facility] for follow-up for HTN. Pt reports experiencing an echo in his left ear; pt suspects cerumen occlusion. Pt notices it when he gets out of the shower. Pt denies using Q-Tip to clean is ears. Pt denies tinnitus, ear pain, dizziness, light-headedness, HA, congestion, fever, and visual disturbance.  Cancer Screening: Pt hasn't got a colonoscopy.  Immunizations: Pt states his last tetanus shot was when he came here for a stye. Pt declines getting a tetanus shot and plans on getting it the next time he comes in.  Hep C / HIVScreening: Pt would like to get screened. Pt denies risky behavior.  HTN: Last office visit August 2016, elevated in office but home readings improved. Was continued on 2 tablets of Zestoretic QD and Norvasc 2.5 mg each day. He was advised to return for physical in 6 months. Pt states his bp has been ranging around 120-130/70. Pt denies SOB, chest pain, or chest tightness with exertion during his games. Pt denies experiencing any side affects on his HTN medications.  Lab Results  Component Value Date   CREATININE 0.87 02/06/2015   HLD: He was advised to start Pravastatin 20 mg due to elevated 10 year heart disease risk. Pt doesn't want to takes cholesterol medication because of the rumors he's heard over the years- "it eats your liver up". Pt states he will take it if he needs it. Lab Results  Component Value Date   CHOL 188 02/06/2015   HDL 24*  02/06/2015   LDLCALC 100 02/06/2015   TRIG 319* 02/06/2015   CHOLHDL 7.8* 02/06/2015   Lab Results  Component Value Date   ALT 13 02/06/2015   AST 21 02/06/2015   ALKPHOS 62 02/06/2015   BILITOT 0.3 02/06/2015   Hyperglycemia: A1c was increased from 1 year prior. Lab Results  Component Value Date   HGBA1C 5.5 02/06/2015   Wt Readings from Last 3 Encounters:  12/28/15 229 lb (103.874 kg)  02/06/15 227 lb 6.4 oz (103.148 kg)  04/15/14 223 lb (101.152 kg)   Tobacco Abuse: See last visit. Cessation was discussed with possible Chantix. Pt smokes about a pack a day. Pt quit for 3 months, and when he had a cigar while golfing and he's been smoking since. Pt states he plans to quit smoking soon with his wife.  There are no active problems to display for this patient.  Past Medical History  Diagnosis Date  . Hypertension    Past Surgical History  Procedure Laterality Date  . Vasectomy     No Known Allergies Prior to Admission medications   Medication Sig Start Date End Date Taking? Authorizing Provider  amLODipine (NORVASC) 2.5 MG tablet Take 1 tablet (2.5 mg total) by mouth daily. 04/07/15   Morrell Riddle, PA-C  fish oil-omega-3 fatty acids 1000 MG capsule Take 2 g by mouth daily.    Historical Provider, MD  lisinopril-hydrochlorothiazide (PRINZIDE,ZESTORETIC) 20-12.5 MG tablet Take 2 tablets by mouth daily. 11/10/15  Shade FloodJeffrey R Leotis Isham, MD  Multiple Vitamin (MULTIVITAMIN) tablet Take 1 tablet by mouth daily.    Historical Provider, MD  pravastatin (PRAVACHOL) 20 MG tablet Take 1 tablet (20 mg total) by mouth daily. 02/15/15   Shade FloodJeffrey R Avnoor Koury, MD   Social History   Social History  . Marital Status: Unknown    Spouse Name: N/A  . Number of Children: N/A  . Years of Education: N/A   Occupational History  . Sales    Social History Main Topics  . Smoking status: Current Every Day Smoker -- 1.00 packs/day for 30 years    Types: Cigarettes  . Smokeless tobacco: Not on file    . Alcohol Use: Yes     Comment: beer  . Drug Use: No  . Sexual Activity:    Partners: Female   Other Topics Concern  . Not on file   Social History Narrative   Patient walks for exercise.   Review of Systems  Constitutional: Negative for fever.  HENT: Negative for congestion, ear pain and tinnitus.   Eyes: Negative for visual disturbance.  Respiratory: Negative for cough, chest tightness and shortness of breath.   Cardiovascular: Negative for chest pain.  Gastrointestinal: Negative for abdominal pain.  Neurological: Negative for dizziness, light-headedness and headaches.   Objective:  BP 126/80 mmHg  Pulse 98  Temp(Src) 98.5 F (36.9 C) (Oral)  Resp 16  Ht 6\' 1"  (1.854 m)  Wt 229 lb (103.874 kg)  BMI 30.22 kg/m2  SpO2 96%  Physical Exam  Constitutional: He appears well-developed and well-nourished. No distress.  HENT:  Head: Normocephalic and atraumatic.  Dark cerumen bilaterally: not obstructive on the right, left side was obstructed.  Eyes: Conjunctivae are normal.  Neck: Neck supple.  Cardiovascular: Normal rate, regular rhythm and normal heart sounds.  Exam reveals no gallop and no friction rub.   No murmur heard. Pulmonary/Chest: Effort normal and breath sounds normal. No respiratory distress. He has no wheezes. He has no rales.  Abdominal: There is no tenderness.  Musculoskeletal: He exhibits no edema.  Neurological: He is alert.  Skin: Skin is warm and dry.  Psychiatric: He has a normal mood and affect. His behavior is normal.  Nursing note and vitals reviewed.  Assessment & Plan:   Jonathan Bush is a [age] y.o. male Essential hypertension - Plan: COMPLETE METABOLIC PANEL WITH GFR, amLODipine (NORVASC) 2.5 MG tablet, lisinopril-hydrochlorothiazide (PRINZIDE,ZESTORETIC) 20-12.5 MG tablet  - Stable. No change in medications for now.  Hyperlipidemia - Plan: COMPLETE METABOLIC PANEL WITH GFR, Lipid panel  - Elevated previously, but of versed to statins. Will  recheck lipids, and discussed 10 year risk as well as options. We did discuss overall risks and benefits of statins in the office, and understanding was expressed,questions answered.  Tobacco abuse  - Cessation recommended. Resources provided below.  Hyperglycemia - Plan: Hemoglobin A1c  - Recheck A1c. Work on diet and exercise.  Screening for HIV (human immunodeficiency virus) - Plan: HIV antibody  Need for hepatitis C screening test - Plan: Hepatitis C antibody  Cerumen impaction, left  - Lavage performed by staff with resolution of symptoms. RTC precautions, including if other ear becomes impacted.  Meds ordered this encounter  Medications  . amLODipine (NORVASC) 2.5 MG tablet    Sig: Take 1 tablet (2.5 mg total) by mouth daily.    Dispense:  90 tablet    Refill:  1  . lisinopril-hydrochlorothiazide (PRINZIDE,ZESTORETIC) 20-12.5 MG tablet    Sig: Take 2 tablets  by mouth daily.    Dispense:  180 tablet    Refill:  1   Patient Instructions       IF you received an x-ray today, you will receive an invoice from [facility]. Please contact [facility] at [phone] with questions or concerns regarding your invoice.   IF you received labwork today, you will receive an invoice from [employer]. Please contact [facility] at [phone] with questions or concerns regarding your invoice.   Our billing staff will not be able to assist you with questions regarding bills from these companies.  You will be contacted with the lab results as soon as they are available. The fastest way to get your results is to activate your My Chart account. Instructions are located on the last page of this paperwork. If you have not heard from [place] regarding the results in 2 weeks, please contact this office.    Return next week for fasting blood work. We can discuss options for cholesterol if needed at that time. No changes in medications for now. Follow-up  with me in the next 6 months for a physical. If any difficulty with quitting smoking, let me know if I can help. See resources below.  [place] offers smoking cessation clinics. Registration is required. To register call [phone] or register online at [email or web address].  Smoking Cessation, Tips for Success If you are ready to quit smoking, congratulations! You have chosen to help yourself be healthier. Cigarettes bring nicotine, tar, carbon monoxide, and other irritants into your body. Your lungs, heart, and blood vessels will be able to work better without these poisons. There are many different ways to quit smoking. Nicotine gum, nicotine patches, a nicotine inhaler, or nicotine nasal spray can help with physical craving. Hypnosis, support groups, and medicines help break the habit of smoking. WHAT THINGS CAN I DO TO MAKE QUITTING EASIER?  Here are some tips to help you quit for good:  Pick a date when you will quit smoking completely. Tell all of your friends and family about your plan to quit on that date.  Do not try to slowly cut down on the number of cigarettes you are smoking. Pick a quit date and quit smoking completely starting on that day.  Throw away all cigarettes.   Clean and remove all ashtrays from your home, work, and car.  On a card, write down your reasons for quitting. Carry the card with you and read it when you get the urge to smoke.  Cleanse your body of nicotine. Drink enough water and fluids to keep your urine clear or pale yellow. Do this after quitting to flush the nicotine from your body.  Learn to predict your moods. Do not let a bad situation be your excuse to have a cigarette. Some situations in your life might tempt you into wanting a cigarette.  Never have "just one" cigarette. It leads to wanting another and another. Remind yourself of your decision to quit.  Change habits associated with smoking. If you smoked while driving or when feeling stressed,  try other activities to replace smoking. Stand up when drinking your coffee. Brush your teeth after eating. Sit in a different chair when you read the paper. Avoid alcohol while trying to quit, and try to drink fewer caffeinated beverages. Alcohol and caffeine may urge you to smoke.  Avoid foods and drinks that can trigger a desire to smoke, such as sugary or spicy foods and alcohol.  Ask people who  smoke not to smoke around you.  Have something planned to do right after eating or having a cup of coffee. For example, plan to take a walk or exercise.  Try a relaxation exercise to calm you down and decrease your stress. Remember, you may be tense and nervous for the first 2 weeks after you quit, but this will pass.  Find new activities to keep your hands busy. Play with a pen, coin, or rubber band. Doodle or draw things on paper.  Brush your teeth right after eating. This will help cut down on the craving for the taste of tobacco after meals. You can also try mouthwash.   Use oral substitutes in place of cigarettes. Try using lemon drops, carrots, cinnamon sticks, or chewing gum. Keep them handy so they are available when you have the urge to smoke.  When you have the urge to smoke, try deep breathing.  Designate your home as a nonsmoking area.  If you are a heavy smoker, ask your health care provider about a prescription for nicotine chewing gum. It can ease your withdrawal from nicotine.  Reward yourself. Set aside the cigarette money you save and buy yourself something nice.  Look for support from others. Join a support group or smoking cessation program. Ask someone at home or at work to help you with your plan to quit smoking.  Always ask yourself, "Do I need this cigarette or is this just a reflex?" Tell yourself, "Today, I choose not to smoke," or "I do not want to smoke." You are reminding yourself of your decision to quit.  Do not replace cigarette smoking with electronic  cigarettes (commonly called e-cigarettes). The safety of e-cigarettes is unknown, and some may contain harmful chemicals.  If you relapse, do not give up! Plan ahead and think about what you will do the next time you get the urge to smoke. HOW WILL I FEEL WHEN I QUIT SMOKING? You may have symptoms of withdrawal because your body is used to nicotine (the addictive substance in cigarettes). You may crave cigarettes, be irritable, feel very hungry, cough often, get headaches, or have difficulty concentrating. The withdrawal symptoms are only temporary. They are strongest when you first quit but will go away within 10-14 days. When withdrawal symptoms occur, stay in control. Think about your reasons for quitting. Remind yourself that these are signs that your body is healing and getting used to being without cigarettes. Remember that withdrawal symptoms are easier to treat than the major diseases that smoking can cause.  Even after the withdrawal is over, expect periodic urges to smoke. However, these cravings are generally short lived and will go away whether you smoke or not. Do not smoke! WHAT RESOURCES ARE AVAILABLE TO HELP ME QUIT SMOKING? Your health care provider can direct you to community resources or hospitals for support, which may include:  Group support.  Education.  Hypnosis.  Therapy.   This information is not intended to replace advice given to you by your health care provider. Make sure you discuss any questions you have with your health care provider.   Document Released: 03/01/2004 Document Revised: 06/24/2014 Document Reviewed: 11/19/2012 Elsevier Interactive Patient Education 2016 Elsevier Inc.  Cerumen Impaction The structures of the external ear canal secrete a waxy substance known as cerumen. Excess cerumen can build up in the ear canal, causing a condition known as cerumen impaction. Cerumen impaction can cause ear pain and disrupt the function of the ear. The rate of  cerumen production differs for each individual. In certain individuals, the configuration of the ear canal may decrease his or her ability to naturally remove cerumen. CAUSES Cerumen impaction is caused by excessive cerumen production or buildup. RISK FACTORS  Frequent use of swabs to clean ears.  Having narrow ear canals.  Having eczema.  Being dehydrated. SIGNS AND SYMPTOMS  Diminished hearing.  Ear drainage.  Ear pain.  Ear itch. TREATMENT Treatment may involve:  Over-the-counter or prescription ear drops to soften the cerumen.  Removal of cerumen by a health care provider. This may be done with:  Irrigation with warm water. This is the most common method of removal.  Ear curettes and other instruments.  Surgery. This may be done in severe cases. HOME CARE INSTRUCTIONS  Take medicines only as directed by your health care provider.  Do not insert objects into the ear with the intent of cleaning the ear. PREVENTION  Do not insert objects into the ear, even with the intent of cleaning the ear. Removing cerumen as a part of normal hygiene is not necessary, and the use of swabs in the ear canal is not recommended.  Drink enough water to keep your urine clear or pale yellow.  Control your eczema if you have it. SEEK MEDICAL CARE IF:  You develop ear pain.  You develop bleeding from the ear.  The cerumen does not clear after you use ear drops as directed.   This information is not intended to replace advice given to you by your health care provider. Make sure you discuss any questions you have with your health care provider.   Document Released: 07/11/2004 Document Revised: 06/24/2014 Document Reviewed: 01/18/2015 Elsevier Interactive Patient Education [employer]     I personally performed the services described in this documentation, which was scribed in my presence. The recorded information has been reviewed and considered, and addended by me as  needed.   Signed,   [name], MD [facility].  12/29/2015 10:19 AM

## 2016-08-18 ENCOUNTER — Other Ambulatory Visit: Payer: Self-pay | Admitting: Family Medicine

## 2016-08-18 DIAGNOSIS — I10 Essential (primary) hypertension: Secondary | ICD-10-CM

## 2016-08-19 NOTE — Telephone Encounter (Signed)
Please schedule appt with PCP of providers choice.

## 2016-09-23 ENCOUNTER — Ambulatory Visit (INDEPENDENT_AMBULATORY_CARE_PROVIDER_SITE_OTHER): Payer: Managed Care, Other (non HMO) | Admitting: Family Medicine

## 2016-09-23 DIAGNOSIS — R739 Hyperglycemia, unspecified: Secondary | ICD-10-CM

## 2016-09-23 DIAGNOSIS — Z114 Encounter for screening for human immunodeficiency virus [HIV]: Secondary | ICD-10-CM | POA: Diagnosis not present

## 2016-09-23 DIAGNOSIS — I10 Essential (primary) hypertension: Secondary | ICD-10-CM | POA: Diagnosis not present

## 2016-09-23 DIAGNOSIS — Z1159 Encounter for screening for other viral diseases: Secondary | ICD-10-CM | POA: Diagnosis not present

## 2016-09-23 DIAGNOSIS — E785 Hyperlipidemia, unspecified: Secondary | ICD-10-CM

## 2016-09-23 MED ORDER — LISINOPRIL-HYDROCHLOROTHIAZIDE 20-12.5 MG PO TABS: 2.0000 | ORAL_TABLET | Freq: Every day | ORAL | 1 refills | Status: DC

## 2016-09-23 MED ORDER — AMLODIPINE BESYLATE 2.5 MG PO TABS: 2.5000 mg | ORAL_TABLET | Freq: Every day | ORAL | 1 refills | Status: DC

## 2016-09-23 NOTE — Patient Instructions (Addendum)
Cut back on alcohol to no more than 2 drinks per day.  I will check fasting labs today.  I would recommend cholesterol medication if cholesterol is elevated as in the past.   When you are ready to quit smoking, let me know if I can help.  Return for physical in the next 3-6 months.   Steps to Quit Smoking Smoking tobacco can be harmful to your health and can affect almost every organ in your body. Smoking puts you, and those around you, at risk for developing many serious chronic diseases. Quitting smoking is difficult, but it is one of the best things that you can do for your health. It is never too late to quit. What are the benefits of quitting smoking? When you quit smoking, you lower your risk of developing serious diseases and conditions, such as:  Lung cancer or lung disease, such as COPD.  Heart disease.  Stroke.  Heart attack.  Infertility.  Osteoporosis and bone fractures. Additionally, symptoms such as coughing, wheezing, and shortness of breath may get better when you quit. You may also find that you get sick less often because your body is stronger at fighting off colds and infections. If you are pregnant, quitting smoking can help to reduce your chances of having a baby of low birth weight. How do I get ready to quit? When you decide to quit smoking, create a plan to make sure that you are successful. Before you quit:  Pick a date to quit. Set a date within the next two weeks to give you time to prepare.  Write down the reasons why you are quitting. Keep this list in places where you will see it often, such as on your bathroom mirror or in your car or wallet.  Identify the people, places, things, and activities that make you want to smoke (triggers) and avoid them. Make sure to take these actions:  Throw away all cigarettes at home, at work, and in your car.  Throw away smoking accessories, such as Set designer.  Clean your car and make sure to empty  the ashtray.  Clean your home, including curtains and carpets.  Tell your family, friends, and coworkers that you are quitting. Support from your loved ones can make quitting easier.  Talk with your health care provider about your options for quitting smoking.  Find out what treatment options are covered by your health insurance. What strategies can I use to quit smoking? Talk with your healthcare provider about different strategies to quit smoking. Some strategies include:  Quitting smoking altogether instead of gradually lessening how much you smoke over a period of time. Research shows that quitting "cold [place]" is more successful than gradually quitting.  Attending in-person counseling to help you build problem-solving skills. You are more likely to have success in quitting if you attend several counseling sessions. Even short sessions of 10 minutes can be effective.  Finding resources and support systems that can help you to quit smoking and remain smoke-free after you quit. These resources are most helpful when you use them often. They can include:  Online chats with a Veterinary surgeon.  Telephone quitlines.  Printed Materials engineer.  Support groups or group counseling.  Text messaging programs.  Mobile phone applications.  Taking medicines to help you quit smoking. (If you are pregnant or breastfeeding, talk with your health care provider first.) Some medicines contain nicotine and some do not. Both types of medicines help with cravings, but the medicines that  include nicotine help to relieve withdrawal symptoms. Your health care provider may recommend:  Nicotine patches, gum, or lozenges.  Nicotine inhalers or sprays.  Non-nicotine medicine that is taken by mouth. Talk with your health care provider about combining strategies, such as taking medicines while you are also receiving in-person counseling. Using these two strategies together makes you more likely to succeed in  quitting than if you used either strategy on its own. If you are pregnant or breastfeeding, talk with your health care provider about finding counseling or other support strategies to quit smoking. Do not take medicine to help you quit smoking unless told to do so by your health care provider. What things can I do to make it easier to quit? Quitting smoking might feel overwhelming at first, but there is a lot that you can do to make it easier. Take these important actions:  Reach out to your family and friends and ask that they support and encourage you during this time. Call telephone quitlines, reach out to support groups, or work with a counselor for support.  Ask people who smoke to avoid smoking around you.  Avoid places that trigger you to smoke, such as bars, parties, or smoke-break areas at work.  Spend time around people who do not smoke.  Lessen stress in your life, because stress can be a smoking trigger for some people. To lessen stress, try:  Exercising regularly.  Deep-breathing exercises.  Yoga.  Meditating.  Performing a body scan. This involves closing your eyes, scanning your body from head to toe, and noticing which parts of your body are particularly tense. Purposefully relax the muscles in those areas.  Download or purchase mobile phone or tablet apps (applications) that can help you stick to your quit plan by providing reminders, tips, and encouragement. There are many free apps, such as QuitGuide from the [employer] developer for Disease Control and Prevention). You can find other support for quitting smoking (smoking cessation) through smokefree.gov and other websites. How will I feel when I quit smoking? Within the first 24 hours of quitting smoking, you may start to feel some withdrawal symptoms. These symptoms are usually most noticeable 2-3 days after quitting, but they usually do not last beyond 2-3 weeks. Changes or symptoms that you might experience include:  Mood  swings.  Restlessness, anxiety, or irritation.  Difficulty concentrating.  Dizziness.  Strong cravings for sugary foods in addition to nicotine.  Mild weight gain.  Constipation.  Nausea.  Coughing or a sore throat.  Changes in how your medicines work in your body.  A depressed mood.  Difficulty sleeping (insomnia). After the first 2-3 weeks of quitting, you may start to notice more positive results, such as:  Improved sense of smell and taste.  Decreased coughing and sore throat.  Slower heart rate.  Lower blood pressure.  Clearer skin.  The ability to breathe more easily.  Fewer sick days. Quitting smoking is very challenging for most people. Do not get discouraged if you are not successful the first time. Some people need to make many attempts to quit before they achieve long-term success. Do your best to stick to your quit plan, and talk with your health care provider if you have any questions or concerns. This information is not intended to replace advice given to you by your health care provider. Make sure you discuss any questions you have with your health care provider. Document Released: 05/28/2001 Document Revised: 01/30/2016 Document Reviewed: 10/18/2014 Elsevier Interactive Patient Education  2017 Elsevier Inc.    IF you received an x-ray today, you will receive an invoice from [facility]. Please contact [facility] at [phone] with questions or concerns regarding your invoice.   IF you received labwork today, you will receive an invoice from [facility]. Please contact [facility] at [phone] with questions or concerns regarding your invoice.   Our billing staff will not be able to assist you with questions regarding bills from these companies.  You will be contacted with the lab results as soon as they are available. The fastest way to get your results is to activate your My Chart account. Instructions are located on the last page  of this paperwork. If you have not heard from [place] regarding the results in 2 weeks, please contact this office.

## 2016-09-23 NOTE — Progress Notes (Signed)
By signing my name below, I, Jonathan Bush, attest that this documentation has been prepared under the direction and in the presence of Jonathan Staggers, MD.  Electronically Signed: Arvilla Market, Medical Scribe. 09/23/16. 9:28 AM.  Subjective:    Patient ID: Jonathan Bush, male    DOB: 29-Mar-1961, 56 y.o.   MRN: [id number]  HPI Chief Complaint  Patient presents with  . Medication Refill    amlodipine and lisinopril     HPI Comments: Jonathan Bush is a 56 y.o. male who presents to the Primary Care at [facility] and [facility] for a HTN follow-up. Advised to return in 6 months for a physical at his 12/2015 visit. Pt is fasting.  HTN: Last seen July 2017. BP was controlled at that time with home 120-130/70s. Continued on lisinopril-HCTZ 20/12.5 mg BID, and amlodipine 2.5 mg. Pt's bp normally runs around 130/72. Pt is complaint with his medication without any missed doses. Pt drank 10 beers yesterday during his golf tournament. Pt socially drinks 10 beers 1x every week and. Denies addiction to alcohol. Denies SOB, chest pain , difficulty breathing, or abdominal pain.  HLD: prescribed pravastatin in the past  But had not taken that medication at that point. He was advised to return for lab only at that 12/28/15 visit and he did not have that lab work done. Pt has not started pravastatin and would like to see his his lab work from today before starting it. Lab Results  Component Value Date   CHOL 188 02/06/2015   HDL 24 (L) 02/06/2015   LDLCALC 100 02/06/2015   TRIG 319 (H) 02/06/2015   CHOLHDL 7.8 (H) 02/06/2015   Lab Results  Component Value Date   ALT 13 02/06/2015   AST 21 02/06/2015   ALKPHOS 62 02/06/2015   BILITOT 0.3 02/06/2015   Tobacco abuse: He has had attempts at quitting in the past. Pt continues to smoke 1 ppd.  There are no active problems to display for this patient.  Past Medical History:  Diagnosis Date  . Hypertension    Past Surgical History:    Procedure Laterality Date  . VASECTOMY     No Known Allergies Prior to Admission medications   Medication Sig Start Date End Date Taking? Authorizing Provider  amLODipine (NORVASC) 2.5 MG tablet TAKE 1 TABLET(2.5 MG) BY MOUTH DAILY 08/19/16  Yes Morrell Riddle, PA-C  fish oil-omega-3 fatty acids 1000 MG capsule Take 2 g by mouth daily.   Yes Historical Provider, MD  lisinopril-hydrochlorothiazide (PRINZIDE,ZESTORETIC) 20-12.5 MG tablet TAKE 2 TABLETS BY MOUTH DAILY 08/19/16  Yes Morrell Riddle, PA-C  Multiple Vitamin (MULTIVITAMIN) tablet Take 1 tablet by mouth daily.   Yes Historical Provider, MD  pravastatin (PRAVACHOL) 20 MG tablet Take 1 tablet (20 mg total) by mouth daily. 02/15/15  Yes Shade Flood, MD   Social History   Social History  . Marital status: Unknown    Spouse name: N/A  . Number of children: N/A  . Years of education: N/A   Occupational History  . [employer]   Social History Main Topics  . Smoking status: Current Every Day Smoker    Packs/day: 1.00    Years: 30.00    Types: Cigarettes  . Smokeless tobacco: Never Used  . Alcohol use Yes     Comment: beer  . Drug use: No  . Sexual activity: Yes    Partners: Female   Other Topics Concern  . Not on file  Social History Narrative   Patient walks for exercise.   Review of Systems  Respiratory: Negative for shortness of breath and wheezing.   Cardiovascular: Negative for chest pain.  Gastrointestinal: Negative for abdominal pain.    Objective:  Physical Exam  Constitutional: He appears well-developed and well-nourished. No distress.  HENT:  Head: Normocephalic and atraumatic.  Eyes: Conjunctivae are normal.  Neck: Neck supple.  Cardiovascular: Normal rate, regular rhythm and normal heart sounds.  Exam reveals no gallop and no friction rub.   No murmur heard. Pulmonary/Chest: Effort normal and breath sounds normal. No respiratory distress. He has no wheezes. He has no rales.  Abdominal: There  is no tenderness.  Neurological: He is alert.  Skin: Skin is warm and dry.  Psychiatric: He has a normal mood and affect. His behavior is normal.  Nursing note and vitals reviewed.   Vitals:   09/23/16 0849  BP: (!) 184/88  Pulse: 81  Resp: 18  Temp: 97.8 F (36.6 C)  TempSrc: Oral  SpO2: 94%  Weight: 235 lb (106.6 kg)  Height:  (1.854 m)  Body mass index is 31 kg/m. Assessment & Plan:    Jonathan Bush is a 56 y.o. male Essential hypertension - Plan: amLODipine (NORVASC) 2.5 MG tablet, lisinopril-hydrochlorothiazide (PRINZIDE,ZESTORETIC) 20-12.5 MG tablet, Comprehensive metabolic panel  - Check labs, continue same medications for now as home readings besides today have been okay, and likely due to overuse of alcohol yesterday. Advised on decreasing alcohol use to no more than 2 drinks per day. Check liver function testing. Monitor home readings and if remaining over 140/90, return for dosing changes.  Hyperlipidemia - Plan: Lipid panel, CANCELED: Lipid panel  - Recommended a statin in the past, especially with other cardiac risk factors including age, hypertension, tobacco abuse. Concerns were discussed with statins in the past as well as today. Consider pravastatin if elevated lipids. RTC precautions.  Hyperglycemia - Plan: Hemoglobin A1c, CANCELED: Hemoglobin A1c  Screening for HIV (human immunodeficiency virus) - Plan: HIV antibody, CANCELED: HIV antibody  Need for hepatitis C screening test - Plan: Hepatitis C Antibody, CANCELED: Hepatitis C antibody  Recheck within the next 3-6 months for a physical. Importance of routine follow-up discussed.  Meds ordered this encounter  Medications  . amLODipine (NORVASC) 2.5 MG tablet    Sig: Take 1 tablet (2.5 mg total) by mouth daily.    Dispense:  90 tablet    Refill:  1  . lisinopril-hydrochlorothiazide (PRINZIDE,ZESTORETIC) 20-12.5 MG tablet    Sig: Take 2 tablets by mouth daily.    Dispense:  180 tablet    Refill:  1    Patient Instructions    Cut back on alcohol to no more than 2 drinks per day.  I will check fasting labs today.  I would recommend cholesterol medication if cholesterol is elevated as in the past.   When you are ready to quit smoking, let me know if I can help.  Return for physical in the next 3-6 months.   Steps to Quit Smoking Smoking tobacco can be harmful to your health and can affect almost every organ in your body. Smoking puts you, and those around you, at risk for developing many serious chronic diseases. Quitting smoking is difficult, but it is one of the best things that you can do for your health. It is never too late to quit. What are the benefits of quitting smoking? When you quit smoking, you lower your risk of developing serious diseases  and conditions, such as:  Lung cancer or lung disease, such as COPD.  Heart disease.  Stroke.  Heart attack.  Infertility.  Osteoporosis and bone fractures. Additionally, symptoms such as coughing, wheezing, and shortness of breath may get better when you quit. You may also find that you get sick less often because your body is stronger at fighting off colds and infections. If you are pregnant, quitting smoking can help to reduce your chances of having a baby of low birth weight. How do I get ready to quit? When you decide to quit smoking, create a plan to make sure that you are successful. Before you quit:  Pick a date to quit. Set a date within the next two weeks to give you time to prepare.  Write down the reasons why you are quitting. Keep this list in places where you will see it often, such as on your bathroom mirror or in your car or wallet.  Identify the people, places, things, and activities that make you want to smoke (triggers) and avoid them. Make sure to take these actions:  Throw away all cigarettes at home, at work, and in your car.  Throw away smoking accessories, such as Set designer.  Clean  your car and make sure to empty the ashtray.  Clean your home, including curtains and carpets.  Tell your family, friends, and coworkers that you are quitting. Support from your loved ones can make quitting easier.  Talk with your health care provider about your options for quitting smoking.  Find out what treatment options are covered by your health insurance. What strategies can I use to quit smoking? Talk with your healthcare provider about different strategies to quit smoking. Some strategies include:  Quitting smoking altogether instead of gradually lessening how much you smoke over a period of time. Research shows that quitting "cold [place]" is more successful than gradually quitting.  Attending in-person counseling to help you build problem-solving skills. You are more likely to have success in quitting if you attend several counseling sessions. Even short sessions of 10 minutes can be effective.  Finding resources and support systems that can help you to quit smoking and remain smoke-free after you quit. These resources are most helpful when you use them often. They can include:  Online chats with a Veterinary surgeon.  Telephone quitlines.  Printed Materials engineer.  Support groups or group counseling.  Text messaging programs.  Mobile phone applications.  Taking medicines to help you quit smoking. (If you are pregnant or breastfeeding, talk with your health care provider first.) Some medicines contain nicotine and some do not. Both types of medicines help with cravings, but the medicines that include nicotine help to relieve withdrawal symptoms. Your health care provider may recommend:  Nicotine patches, gum, or lozenges.  Nicotine inhalers or sprays.  Non-nicotine medicine that is taken by mouth. Talk with your health care provider about combining strategies, such as taking medicines while you are also receiving in-person counseling. Using these two strategies together makes  you more likely to succeed in quitting than if you used either strategy on its own. If you are pregnant or breastfeeding, talk with your health care provider about finding counseling or other support strategies to quit smoking. Do not take medicine to help you quit smoking unless told to do so by your health care provider. What things can I do to make it easier to quit? Quitting smoking might feel overwhelming at first, but there is a lot that you  can do to make it easier. Take these important actions:  Reach out to your family and friends and ask that they support and encourage you during this time. Call telephone quitlines, reach out to support groups, or work with a counselor for support.  Ask people who smoke to avoid smoking around you.  Avoid places that trigger you to smoke, such as bars, parties, or smoke-break areas at work.  Spend time around people who do not smoke.  Lessen stress in your life, because stress can be a smoking trigger for some people. To lessen stress, try:  Exercising regularly.  Deep-breathing exercises.  Yoga.  Meditating.  Performing a body scan. This involves closing your eyes, scanning your body from head to toe, and noticing which parts of your body are particularly tense. Purposefully relax the muscles in those areas.  Download or purchase mobile phone or tablet apps (applications) that can help you stick to your quit plan by providing reminders, tips, and encouragement. There are many free apps, such as QuitGuide from the [employer] developer for Disease Control and Prevention). You can find other support for quitting smoking (smoking cessation) through smokefree.gov and other websites. How will I feel when I quit smoking? Within the first 24 hours of quitting smoking, you may start to feel some withdrawal symptoms. These symptoms are usually most noticeable 2-3 days after quitting, but they usually do not last beyond 2-3 weeks. Changes or symptoms that you  might experience include:  Mood swings.  Restlessness, anxiety, or irritation.  Difficulty concentrating.  Dizziness.  Strong cravings for sugary foods in addition to nicotine.  Mild weight gain.  Constipation.  Nausea.  Coughing or a sore throat.  Changes in how your medicines work in your body.  A depressed mood.  Difficulty sleeping (insomnia). After the first 2-3 weeks of quitting, you may start to notice more positive results, such as:  Improved sense of smell and taste.  Decreased coughing and sore throat.  Slower heart rate.  Lower blood pressure.  Clearer skin.  The ability to breathe more easily.  Fewer sick days. Quitting smoking is very challenging for most people. Do not get discouraged if you are not successful the first time. Some people need to make many attempts to quit before they achieve long-term success. Do your best to stick to your quit plan, and talk with your health care provider if you have any questions or concerns. This information is not intended to replace advice given to you by your health care provider. Make sure you discuss any questions you have with your health care provider. Document Released: 05/28/2001 Document Revised: 01/30/2016 Document Reviewed: 10/18/2014 Elsevier Interactive Patient Education  2017 [employer].    IF you received an x-ray today, you will receive an invoice from [facility]. Please contact [facility] at [phone] with questions or concerns regarding your invoice.   IF you received labwork today, you will receive an invoice from [facility]. Please contact [facility] at [phone] with questions or concerns regarding your invoice.   Our billing staff will not be able to assist you with questions regarding bills from these companies.  You will be contacted with the lab results as soon as they are available. The fastest way to get your results is to activate your My Chart account.  Instructions are located on the last page of this paperwork. If you have not heard from [place] regarding the results in 2 weeks, please contact this office.  I personally performed the services described in this documentation, which was scribed in my presence. The recorded information has been reviewed and considered for accuracy and completeness, addended by me as needed, and agree with information above.  Signed,   Jonathan Staggers, MD Primary Care at [facility].  09/23/16 9:53 AM

## 2016-09-24 LAB — HEMOGLOBIN A1C
Est. average glucose Bld gHb Est-mCnc: 123 mg/dL
Hgb A1c MFr Bld: 5.9 % — ABNORMAL HIGH (ref 4.8–5.6)

## 2016-09-24 LAB — COMPREHENSIVE METABOLIC PANEL
A/G RATIO: 1.5 (ref 1.2–2.2)
ALK PHOS: 82 IU/L (ref 39–117)
ALT: 13 IU/L (ref 0–44)
AST: 22 IU/L (ref 0–40)
Albumin: 4.6 g/dL (ref 3.5–5.5)
BILIRUBIN TOTAL: 0.4 mg/dL (ref 0.0–1.2)
BUN/Creatinine Ratio: 13 (ref 9–20)
BUN: 12 mg/dL (ref 6–24)
CHLORIDE: 100 mmol/L (ref 96–106)
CO2: 27 mmol/L (ref 18–29)
Calcium: 9.5 mg/dL (ref 8.7–10.2)
Creatinine, Ser: 0.93 mg/dL (ref 0.76–1.27)
GFR calc non Af Amer: 93 mL/min/{1.73_m2} (ref >59)
GFR, EST AFRICAN AMERICAN: 107 mL/min/{1.73_m2} (ref >59)
Globulin, Total: 3.1 g/dL (ref 1.5–4.5)
Glucose: 111 mg/dL — ABNORMAL HIGH (ref 65–99)
POTASSIUM: 4.8 mmol/L (ref 3.5–5.2)
Sodium: 138 mmol/L (ref 134–144)
Total Protein: 7.7 g/dL (ref 6.0–8.5)

## 2016-09-24 LAB — LIPID PANEL
CHOL/HDL RATIO: 6.2 ratio — AB (ref 0.0–5.0)
Cholesterol, Total: 206 mg/dL — ABNORMAL HIGH (ref 100–199)
HDL: 33 mg/dL — AB (ref >39)
LDL CALC: 137 mg/dL — AB (ref 0–99)
Triglycerides: 179 mg/dL — ABNORMAL HIGH (ref 0–149)
VLDL CHOLESTEROL CAL: 36 mg/dL (ref 5–40)

## 2016-09-24 LAB — HEPATITIS C ANTIBODY: Hep C Virus Ab: <0.1 s/co ratio (ref 0.0–0.9)

## 2016-09-24 LAB — HIV ANTIBODY (ROUTINE TESTING W REFLEX): HIV SCREEN 4TH GENERATION: NONREACTIVE

## 2016-10-03 ENCOUNTER — Other Ambulatory Visit: Payer: Self-pay | Admitting: Family Medicine

## 2016-10-03 DIAGNOSIS — E785 Hyperlipidemia, unspecified: Secondary | ICD-10-CM

## 2016-10-03 MED ORDER — PRAVASTATIN SODIUM 20 MG PO TABS: 20.0000 mg | ORAL_TABLET | Freq: Every day | ORAL | 3 refills | Status: DC

## 2016-10-03 NOTE — Progress Notes (Signed)
See lab notes

## 2017-03-20 ENCOUNTER — Encounter: Payer: Managed Care, Other (non HMO) | Admitting: Family Medicine

## 2017-05-02 ENCOUNTER — Ambulatory Visit: Payer: Managed Care, Other (non HMO) | Admitting: Family Medicine

## 2017-05-02 ENCOUNTER — Encounter: Payer: Self-pay | Admitting: Family Medicine

## 2017-05-02 DIAGNOSIS — E785 Hyperlipidemia, unspecified: Secondary | ICD-10-CM | POA: Diagnosis not present

## 2017-05-02 DIAGNOSIS — I10 Essential (primary) hypertension: Secondary | ICD-10-CM

## 2017-05-02 DIAGNOSIS — R7303 Prediabetes: Secondary | ICD-10-CM

## 2017-05-02 MED ORDER — AMLODIPINE BESYLATE 2.5 MG PO TABS: 2.5000 mg | ORAL_TABLET | Freq: Every day | ORAL | 1 refills | Status: DC

## 2017-05-02 MED ORDER — LISINOPRIL-HYDROCHLOROTHIAZIDE 20-12.5 MG PO TABS: 2.0000 | ORAL_TABLET | Freq: Every day | ORAL | 1 refills | Status: DC

## 2017-05-02 NOTE — Patient Instructions (Addendum)
Thanks for coming in today.   Keep a record of your blood pressures outside of the office and if over 140/90 - return to discuss med changes.   See info on cholesterol form Up to Date. If cholesterol is still elevated, can look at risk again, but likely will still recommend statin.   I will check blood sugar level again, but expect that to improve with weight loss. Keep up the good work.   [place] offers smoking cessation clinics. Registration is required. To register call [phone] or register online at [email or web address].  Steps to Quit Smoking Smoking tobacco can be harmful to your health and can affect almost every organ in your body. Smoking puts you, and those around you, at risk for developing many serious chronic diseases. Quitting smoking is difficult, but it is one of the best things that you can do for your health. It is never too late to quit. What are the benefits of quitting smoking? When you quit smoking, you lower your risk of developing serious diseases and conditions, such as:  Lung cancer or lung disease, such as COPD.  Heart disease.  Stroke.  Heart attack.  Infertility.  Osteoporosis and bone fractures.  Additionally, symptoms such as coughing, wheezing, and shortness of breath may get better when you quit. You may also find that you get sick less often because your body is stronger at fighting off colds and infections. If you are pregnant, quitting smoking can help to reduce your chances of having a baby of low birth weight. How do I get ready to quit? When you decide to quit smoking, create a plan to make sure that you are successful. Before you quit:  Pick a date to quit. Set a date within the next two weeks to give you time to prepare.  Write down the reasons why you are quitting. Keep this list in places where you will see it often, such as on your bathroom mirror or in your car or wallet.  Identify the people, places, things, and activities that make  you want to smoke (triggers) and avoid them. Make sure to take these actions: ? Throw away all cigarettes at home, at work, and in your car. ? Throw away smoking accessories, such as Set designerashtrays and lighters. ? Clean your car and make sure to empty the ashtray. ? Clean your home, including curtains and carpets.  Tell your family, friends, and coworkers that you are quitting. Support from your loved ones can make quitting easier.  Talk with your health care provider about your options for quitting smoking.  Find out what treatment options are covered by your health insurance.  What strategies can I use to quit smoking? Talk with your healthcare provider about different strategies to quit smoking. Some strategies include:  Quitting smoking altogether instead of gradually lessening how much you smoke over a period of time. Research shows that quitting "cold [place]" is more successful than gradually quitting.  Attending in-person counseling to help you build problem-solving skills. You are more likely to have success in quitting if you attend several counseling sessions. Even short sessions of 10 minutes can be effective.  Finding resources and support systems that can help you to quit smoking and remain smoke-free after you quit. These resources are most helpful when you use them often. They can include: ? Online chats with a Veterinary surgeoncounselor. ? Telephone quitlines. ? Automotive engineerrinted self-help materials. ? Support groups or group counseling. ? Text messaging programs. ? Mobile phone applications.  Taking medicines to help you quit smoking. (If you are pregnant or breastfeeding, talk with your health care provider first.) Some medicines contain nicotine and some do not. Both types of medicines help with cravings, but the medicines that include nicotine help to relieve withdrawal symptoms. Your health care provider may recommend: ? Nicotine patches, gum, or lozenges. ? Nicotine inhalers or  sprays. ? Non-nicotine medicine that is taken by mouth.  Talk with your health care provider about combining strategies, such as taking medicines while you are also receiving in-person counseling. Using these two strategies together makes you more likely to succeed in quitting than if you used either strategy on its own. If you are pregnant or breastfeeding, talk with your health care provider about finding counseling or other support strategies to quit smoking. Do not take medicine to help you quit smoking unless told to do so by your health care provider. What things can I do to make it easier to quit? Quitting smoking might feel overwhelming at first, but there is a lot that you can do to make it easier. Take these important actions:  Reach out to your family and friends and ask that they support and encourage you during this time. Call telephone quitlines, reach out to support groups, or work with a counselor for support.  Ask people who smoke to avoid smoking around you.  Avoid places that trigger you to smoke, such as bars, parties, or smoke-break areas at work.  Spend time around people who do not smoke.  Lessen stress in your life, because stress can be a smoking trigger for some people. To lessen stress, try: ? Exercising regularly. ? Deep-breathing exercises. ? Yoga. ? Meditating. ? Performing a body scan. This involves closing your eyes, scanning your body from head to toe, and noticing which parts of your body are particularly tense. Purposefully relax the muscles in those areas.  Download or purchase mobile phone or tablet apps (applications) that can help you stick to your quit plan by providing reminders, tips, and encouragement. There are many free apps, such as QuitGuide from the [employer] Office manager for Disease Control and Prevention). You can find other support for quitting smoking (smoking cessation) through smokefree.gov and other websites.  How will I feel when I quit  smoking? Within the first 24 hours of quitting smoking, you may start to feel some withdrawal symptoms. These symptoms are usually most noticeable 2-3 days after quitting, but they usually do not last beyond 2-3 weeks. Changes or symptoms that you might experience include:  Mood swings.  Restlessness, anxiety, or irritation.  Difficulty concentrating.  Dizziness.  Strong cravings for sugary foods in addition to nicotine.  Mild weight gain.  Constipation.  Nausea.  Coughing or a sore throat.  Changes in how your medicines work in your body.  A depressed mood.  Difficulty sleeping (insomnia).  After the first 2-3 weeks of quitting, you may start to notice more positive results, such as:  Improved sense of smell and taste.  Decreased coughing and sore throat.  Slower heart rate.  Lower blood pressure.  Clearer skin.  The ability to breathe more easily.  Fewer sick days.  Quitting smoking is very challenging for most people. Do not get discouraged if you are not successful the first time. Some people need to make many attempts to quit before they achieve long-term success. Do your best to stick to your quit plan, and talk with your health care provider if you have any questions or  concerns. This information is not intended to replace advice given to you by your health care provider. Make sure you discuss any questions you have with your health care provider. Document Released: 05/28/2001 Document Revised: 01/30/2016 Document Reviewed: 10/18/2014 Elsevier Interactive Patient Education  2017 [employer]     IF you received an x-ray today, you will receive an invoice [facility]. Please contact [facility] at [phone] with questions or concerns regarding your invoice.   IF you received labwork today, you will receive an invoice from [facility]. Please contact [facility] at [phone] with questions or concerns regarding your invoice.   Our  billing staff will not be able to assist you with questions regarding bills from these companies.  You will be contacted with the lab results as soon as they are available. The fastest way to get your results is to activate your My Chart account. Instructions are located on the last page of this paperwork. If you have not heard from us regarding the results in 2 weeks, please contact this office.

## 2017-05-02 NOTE — Progress Notes (Signed)
Subjective:  By signing my name below, I, Stann Oresung-Kai Tsai, attest that this documentation has been prepared under the direction and in the presence of Meredith StaggersJeffrey Lynnea Vandervoort, MD. Electronically Signed: Stann Oresung-Kai Tsai, Scribe. 05/02/2017 , 4:14 PM .  Patient was seen in Room 1 .   Patient ID: Jonathan Bush, male    DOB: 02-18-1961, 56 y.o.   MRN: [id number] Chief Complaint  Patient presents with  . Medication Refill   HPI Jonathan Bush is a 56 y.o. male Here for follow up.   HTN He takes Norvasc 2.5mg  QD and Lisinopril-HCTZ 40-25mg  QD.   Lab Results  Component Value Date   CREATININE 0.93 09/23/2016   He reports checking his BP at home, ranging low 130s/60s-70s.   Hyperlipidemia pravastatin 20mg  QD previously recommended, see last lab note. He didn't take the pravastatin because he heard about side effects and didn't want to risk taking it.   The 10-year ASCVD risk score [name](Goff DC Montez HagemanJr., et al., 2013) is: 20.3%   Values used to calculate the score:     Age: 89 years     Sex: Male     Is Non-Hispanic African American: No     Diabetic: No     Tobacco smoker: Yes     Systolic Blood Pressure: 138 mmHg     Is BP treated: Yes     HDL Cholesterol: 27 mg/dL     Total Cholesterol: 187 mg/dL Score refreshed after visit. Initial score 22%  Lab Results  Component Value Date   CHOL 206 (H) 09/23/2016   HDL 33 (L) 09/23/2016   LDLCALC 137 (H) 09/23/2016   TRIG 179 (H) 09/23/2016   CHOLHDL 6.2 (H) 09/23/2016   Lab Results  Component Value Date   ALT 13 09/23/2016   AST 22 09/23/2016   ALKPHOS 82 09/23/2016   BILITOT 0.4 09/23/2016    Pre-DM Lab Results  Component Value Date   HGBA1C 5.9 (H) 09/23/2016   Weight loss, exercise and diet changes were discussed.   Wt Readings from Last 3 Encounters:  05/02/17 228 lb (103.4 kg)  09/23/16 235 lb (106.6 kg)  12/28/15 229 lb (103.9 kg)    Tobacco abuse Cessation has been discussed previously. He has had attempts at quitting in  the past. Advised to let us know when he was ready to quit smoking. He notes his attempts were quitting cold [place].   Patient Active Problem List   Diagnosis Date Noted  . Essential hypertension 05/02/2017  . Hyperlipidemia 05/02/2017  . Prediabetes 05/02/2017   Past Medical History:  Diagnosis Date  . Hypertension    Past Surgical History:  Procedure Laterality Date  . VASECTOMY     No Known Allergies Prior to Admission medications   Medication Sig Start Date End Date Taking? Authorizing Provider  amLODipine (NORVASC) 2.5 MG tablet Take 1 tablet (2.5 mg total) by mouth daily. 09/23/16  Yes Shade FloodGreene, Savilla Turbyfill R, MD  fish oil-omega-3 fatty acids 1000 MG capsule Take 2 g by mouth daily.   Yes [provider]  lisinopril-hydrochlorothiazide (PRINZIDE,ZESTORETIC) 20-12.5 MG tablet Take 2 tablets by mouth daily. 09/23/16  Yes Shade FloodGreene, Hazelynn Mckenny R, MD  Multiple Vitamin (MULTIVITAMIN) tablet Take 1 tablet by mouth daily.   Yes [provider]  pravastatin (PRAVACHOL) 20 MG tablet Take 1 tablet (20 mg total) by mouth daily. 10/03/16  Yes Shade FloodGreene, Ellary Casamento R, MD   Social History   Socioeconomic History  . Marital status: Unknown    Spouse  name: Not on file  . Number of children: Not on file  . Years of education: Not on file  . Highest education level: Not on file  Social Needs  . Financial resource strain: Not on file  . Food insecurity - worry: Not on file  . Food insecurity - inability: Not on file  . Transportation needs - medical: Not on file  . Transportation needs - non-medical: Not on file  Occupational History  . Occupation: Investment banker, corporate: [employer]  Tobacco Use  . Smoking status: Current Every Day Smoker    Packs/day: 1.00    Years: 30.00    Pack years: 30.00    Types: Cigarettes  . Smokeless tobacco: Never Used  Substance and Sexual Activity  . Alcohol use: Yes    Comment: beer  . Drug use: No  . Sexual activity: Yes    Partners: Female  Other  Topics Concern  . Not on file  Social History Narrative   Patient walks for exercise.   Review of Systems  Constitutional: Negative for fatigue and unexpected weight change.  Eyes: Negative for visual disturbance.  Respiratory: Negative for cough, chest tightness and shortness of breath.   Cardiovascular: Negative for chest pain, palpitations and leg swelling.  Gastrointestinal: Negative for abdominal pain and blood in stool.  Neurological: Negative for dizziness, light-headedness and headaches.       Objective:   Physical Exam  Constitutional: He is oriented to person, place, and time. He appears well-developed and well-nourished.  HENT:  Head: Normocephalic and atraumatic.  Eyes: EOM are normal. Pupils are equal, round, and reactive to light.  Neck: No JVD present. Carotid bruit is not present.  Cardiovascular: Normal rate, regular rhythm and normal heart sounds.  No murmur heard. Pulmonary/Chest: Effort normal and breath sounds normal. He has no rales.  Musculoskeletal: He exhibits no edema.  Neurological: He is alert and oriented to person, place, and time.  Skin: Skin is warm and dry.  Psychiatric: He has a normal mood and affect.  Vitals reviewed.   Vitals:   05/02/17 1544 05/02/17 1621  BP: (!) 154/84 138/80  Pulse: 94   Resp: 17   Temp: 98.5 F (36.9 C)   TempSrc: Oral   SpO2: 98%   Weight: 228 lb (103.4 kg)   Height: 6\' 1"  (1.854 m)    Body mass index is 30.08 kg/m.     Assessment & Plan:    [name] is a 56 y.o. male Essential hypertension - Plan: Comprehensive metabolic panel, lisinopril-hydrochlorothiazide (PRINZIDE,ZESTORETIC) 20-12.5 MG tablet, amLODipine (NORVASC) 2.5 MG tablet  - Improved with repeat testing, tolerating current regimen Home level stable. No change in regimen for now. Labs pending  Hyperlipidemia, unspecified hyperlipidemia type - Plan: Comprehensive metabolic panel, Lipid panel  -ASCVD risk as above discussed including  different components. Statin was recommended based on those levels.  Hesitant to start statin due to information he has heard. Handout given/sent from up-to-date regarding hyperlipidemia treatments and options. Ultimately quitting smoking can help his wrist, as well as right/exercise for weight loss  -Depending on upcoming readings, he is amenable to possibly trying statin again.  Prediabetes - Plan: Hemoglobin A1c  -Encouraged on continued attempts at weight loss with exercise and diet changes. Recheck A1c.  Meds ordered this encounter  Medications  . lisinopril-hydrochlorothiazide (PRINZIDE,ZESTORETIC) 20-12.5 MG tablet    Sig: Take 2 tablets daily by mouth.    Dispense:  180 tablet    Refill:  1  . amLODipine (NORVASC) 2.5 MG tablet    Sig: Take 1 tablet (2.5 mg total) daily by mouth.    Dispense:  90 tablet    Refill:  1   Patient Instructions   Thanks for coming in today.   Keep a record of your blood pressures outside of the office and if over 140/90 - return to discuss med changes.   See info on cholesterol form Up to Date. If cholesterol is still elevated, can look at risk again, but likely will still recommend statin.   I will check blood sugar level again, but expect that to improve with weight loss. Keep up the good work.   [employer] offers smoking cessation clinics. Registration is required. To register call [phone] or register online at [email or web address].  Steps to Quit Smoking Smoking tobacco can be harmful to your health and can affect almost every organ in your body. Smoking puts you, and those around you, at risk for developing many serious chronic diseases. Quitting smoking is difficult, but it is one of the best things that you can do for your health. It is never too late to quit. What are the benefits of quitting smoking? When you quit smoking, you lower your risk of developing serious diseases and conditions, such as:  Lung cancer or lung disease, such as  COPD.  Heart disease.  Stroke.  Heart attack.  Infertility.  Osteoporosis and bone fractures.  Additionally, symptoms such as coughing, wheezing, and shortness of breath may get better when you quit. You may also find that you get sick less often because your body is stronger at fighting off colds and infections. If you are pregnant, quitting smoking can help to reduce your chances of having a baby of low birth weight. How do I get ready to quit? When you decide to quit smoking, create a plan to make sure that you are successful. Before you quit:  Pick a date to quit. Set a date within the next two weeks to give you time to prepare.  Write down the reasons why you are quitting. Keep this list in places where you will see it often, such as on your bathroom mirror or in your car or wallet.  Identify the people, places, things, and activities that make you want to smoke (triggers) and avoid them. Make sure to take these actions: ? Throw away all cigarettes at home, at work, and in your car. ? Throw away smoking accessories, such as Set designer. ? Clean your car and make sure to empty the ashtray. ? Clean your home, including curtains and carpets.  Tell your family, friends, and coworkers that you are quitting. Support from your loved ones can make quitting easier.  Talk with your health care provider about your options for quitting smoking.  Find out what treatment options are covered by your health insurance.  What strategies can I use to quit smoking? Talk with your healthcare provider about different strategies to quit smoking. Some strategies include:  Quitting smoking altogether instead of gradually lessening how much you smoke over a period of time. Research shows that quitting "cold [place]" is more successful than gradually quitting.  Attending in-person counseling to help you build problem-solving skills. You are more likely to have success in quitting if you  attend several counseling sessions. Even short sessions of 10 minutes can be effective.  Finding resources and support systems that can help you to quit smoking and remain smoke-free after you quit.  These resources are most helpful when you use them often. They can include: ? Online chats with a Veterinary surgeoncounselor. ? Telephone quitlines. ? Automotive engineerrinted self-help materials. ? Support groups or group counseling. ? Text messaging programs. ? Mobile phone applications.  Taking medicines to help you quit smoking. (If you are pregnant or breastfeeding, talk with your health care provider first.) Some medicines contain nicotine and some do not. Both types of medicines help with cravings, but the medicines that include nicotine help to relieve withdrawal symptoms. Your health care provider may recommend: ? Nicotine patches, gum, or lozenges. ? Nicotine inhalers or sprays. ? Non-nicotine medicine that is taken by mouth.  Talk with your health care provider about combining strategies, such as taking medicines while you are also receiving in-person counseling. Using these two strategies together makes you more likely to succeed in quitting than if you used either strategy on its own. If you are pregnant or breastfeeding, talk with your health care provider about finding counseling or other support strategies to quit smoking. Do not take medicine to help you quit smoking unless told to do so by your health care provider. What things can I do to make it easier to quit? Quitting smoking might feel overwhelming at first, but there is a lot that you can do to make it easier. Take these important actions:  Reach out to your family and friends and ask that they support and encourage you during this time. Call telephone quitlines, reach out to support groups, or work with a counselor for support.  Ask people who smoke to avoid smoking around you.  Avoid places that trigger you to smoke, such as bars, parties, or smoke-break  areas at work.  Spend time around people who do not smoke.  Lessen stress in your life, because stress can be a smoking trigger for some people. To lessen stress, try: ? Exercising regularly. ? Deep-breathing exercises. ? Yoga. ? Meditating. ? Performing a body scan. This involves closing your eyes, scanning your body from head to toe, and noticing which parts of your body are particularly tense. Purposefully relax the muscles in those areas.  Download or purchase mobile phone or tablet apps (applications) that can help you stick to your quit plan by providing reminders, tips, and encouragement. There are many free apps, such as QuitGuide from the [employer] developer(Centers for Disease Control and Prevention). You can find other support for quitting smoking (smoking cessation) through smokefree.gov and other websites.  How will I feel when I quit smoking? Within the first 24 hours of quitting smoking, you may start to feel some withdrawal symptoms. These symptoms are usually most noticeable 2-3 days after quitting, but they usually do not last beyond 2-3 weeks. Changes or symptoms that you might experience include:  Mood swings.  Restlessness, anxiety, or irritation.  Difficulty concentrating.  Dizziness.  Strong cravings for sugary foods in addition to nicotine.  Mild weight gain.  Constipation.  Nausea.  Coughing or a sore throat.  Changes in how your medicines work in your body.  A depressed mood.  Difficulty sleeping (insomnia).  After the first 2-3 weeks of quitting, you may start to notice more positive results, such as:  Improved sense of smell and taste.  Decreased coughing and sore throat.  Slower heart rate.  Lower blood pressure.  Clearer skin.  The ability to breathe more easily.  Fewer sick days.  Quitting smoking is very challenging for most people. Do not get discouraged if you are not  successful the first time. Some people need to make many attempts to quit  before they achieve long-term success. Do your best to stick to your quit plan, and talk with your health care provider if you have any questions or concerns. This information is not intended to replace advice given to you by your health care provider. Make sure you discuss any questions you have with your health care provider. Document Released: 05/28/2001 Document Revised: 01/30/2016 Document Reviewed: 10/18/2014 Elsevier Interactive Patient Education  2017 [employer].     IF you received an x-ray today, you will receive an invoice from [facility]. Please contact [facility] at [phone] with questions or concerns regarding your invoice.   IF you received labwork today, you will receive an invoice from [facility]. Please contact [facility] at [phone] with questions or concerns regarding your invoice.   Our billing staff will not be able to assist you with questions regarding bills from these companies.  You will be contacted with the lab results as soon as they are available. The fastest way to get your results is to activate your My Chart account. Instructions are located on the last page of this paperwork. If you have not heard from [place] regarding the results in 2 weeks, please contact this office.       I personally performed the services described in this documentation, which was scribed in my presence. The recorded information has been reviewed and considered for accuracy and completeness, addended by me as needed, and agree with information above.  Signed,   [name], MD [facility] at [facility].  05/04/17 10:30 PM

## 2017-05-03 LAB — COMPREHENSIVE METABOLIC PANEL
A/G RATIO: 1.6 (ref 1.2–2.2)
ALK PHOS: 85 IU/L (ref 39–117)
ALT: 15 IU/L (ref 0–44)
AST: 21 IU/L (ref 0–40)
Albumin: 4.6 g/dL (ref 3.5–5.5)
BUN/Creatinine Ratio: 11 (ref 9–20)
BUN: 11 mg/dL (ref 6–24)
Bilirubin Total: 0.3 mg/dL (ref 0.0–1.2)
CALCIUM: 9.8 mg/dL (ref 8.7–10.2)
CO2: 23 mmol/L (ref 20–29)
Chloride: 98 mmol/L (ref 96–106)
Creatinine, Ser: 1 mg/dL (ref 0.76–1.27)
GFR calc Af Amer: 98 mL/min/{1.73_m2} (ref >59)
GFR, EST NON AFRICAN AMERICAN: 85 mL/min/{1.73_m2} (ref >59)
GLOBULIN, TOTAL: 2.9 g/dL (ref 1.5–4.5)
Glucose: 113 mg/dL — ABNORMAL HIGH (ref 65–99)
POTASSIUM: 4.5 mmol/L (ref 3.5–5.2)
SODIUM: 137 mmol/L (ref 134–144)
Total Protein: 7.5 g/dL (ref 6.0–8.5)

## 2017-05-03 LAB — LIPID PANEL
CHOLESTEROL TOTAL: 187 mg/dL (ref 100–199)
Chol/HDL Ratio: 6.9 ratio — ABNORMAL HIGH (ref 0.0–5.0)
HDL: 27 mg/dL — ABNORMAL LOW (ref >39)
LDL CALC: 125 mg/dL — AB (ref 0–99)
TRIGLYCERIDES: 174 mg/dL — AB (ref 0–149)
VLDL CHOLESTEROL CAL: 35 mg/dL (ref 5–40)

## 2017-05-03 LAB — HEMOGLOBIN A1C
Est. average glucose Bld gHb Est-mCnc: 120 mg/dL
HEMOGLOBIN A1C: 5.8 % — AB (ref 4.8–5.6)

## 2017-05-04 ENCOUNTER — Encounter: Payer: Self-pay | Admitting: Family Medicine

## 2017-05-17 ENCOUNTER — Other Ambulatory Visit: Payer: Self-pay | Admitting: Family Medicine

## 2017-05-17 NOTE — Progress Notes (Signed)
See lab notes.   The 10-year ASCVD risk score [name](Goff DC Montez HagemanJr., et al., 2013) is: 20.3%   Values used to calculate the score:     Age: 56 years     Sex: Male     Is Non-Hispanic African American: No     Diabetic: No     Tobacco smoker: Yes     Systolic Blood Pressure: 138 mmHg     Is BP treated: Yes     HDL Cholesterol: 27 mg/dL     Total Cholesterol: 187 mg/dL

## 2017-07-03 ENCOUNTER — Encounter: Payer: Managed Care, Other (non HMO) | Admitting: Family Medicine

## 2017-08-07 ENCOUNTER — Encounter: Payer: Managed Care, Other (non HMO) | Admitting: Family Medicine

## 2017-11-13 ENCOUNTER — Other Ambulatory Visit: Payer: Self-pay | Admitting: Family Medicine

## 2017-11-13 DIAGNOSIS — I10 Essential (primary) hypertension: Secondary | ICD-10-CM

## 2017-12-02 ENCOUNTER — Telehealth: Payer: Self-pay | Admitting: General Practice

## 2017-12-02 NOTE — Telephone Encounter (Unsigned)
Copied from CRM [id number]. Topic: Quick Communication - Rx Refill/Question >> Dec 02, 2017 11:26 AM Marylen PontoMcneil, [name] wrote: Medication: amLODipine (NORVASC) 2.5 MG tablet  and  lisinopril-hydrochlorothiazide (PRINZIDE,ZESTORETIC) 20-12.5 MG tablet   Pt scheduled physical appt for 12/30/17 at 9am   Has the patient contacted their pharmacy? no  Preferred Pharmacy (with phone number or street name): [id number] [place] , [place] - [address] [phone] ([phone] (Fax)   Agent: Please be advised that RX refills may take up to 3 business days. We ask that you follow-up with your pharmacy.

## 2017-12-03 ENCOUNTER — Other Ambulatory Visit: Payer: Self-pay

## 2017-12-03 DIAGNOSIS — I10 Essential (primary) hypertension: Secondary | ICD-10-CM

## 2017-12-03 MED ORDER — LISINOPRIL-HYDROCHLOROTHIAZIDE 20-12.5 MG PO TABS: 2.0000 | ORAL_TABLET | Freq: Every day | ORAL | 0 refills | Status: DC

## 2017-12-03 MED ORDER — AMLODIPINE BESYLATE 2.5 MG PO TABS: 2.5000 mg | ORAL_TABLET | Freq: Every day | ORAL | 0 refills | Status: DC

## 2017-12-29 ENCOUNTER — Other Ambulatory Visit: Payer: Self-pay | Admitting: Family Medicine

## 2017-12-29 DIAGNOSIS — I10 Essential (primary) hypertension: Secondary | ICD-10-CM

## 2017-12-30 ENCOUNTER — Ambulatory Visit (INDEPENDENT_AMBULATORY_CARE_PROVIDER_SITE_OTHER): Payer: BLUE CROSS/BLUE SHIELD | Admitting: Family Medicine

## 2017-12-30 ENCOUNTER — Encounter: Payer: Self-pay | Admitting: Family Medicine

## 2017-12-30 VITALS — BP 162/72 | HR 75 | Temp 98.5°F | Resp 18 | Ht 73.0 in | Wt 221.0 lb

## 2017-12-30 DIAGNOSIS — Z0000 Encounter for general adult medical examination without abnormal findings: Secondary | ICD-10-CM

## 2017-12-30 DIAGNOSIS — Z125 Encounter for screening for malignant neoplasm of prostate: Secondary | ICD-10-CM | POA: Diagnosis not present

## 2017-12-30 DIAGNOSIS — E785 Hyperlipidemia, unspecified: Secondary | ICD-10-CM | POA: Diagnosis not present

## 2017-12-30 DIAGNOSIS — R739 Hyperglycemia, unspecified: Secondary | ICD-10-CM

## 2017-12-30 DIAGNOSIS — R7303 Prediabetes: Secondary | ICD-10-CM

## 2017-12-30 DIAGNOSIS — I10 Essential (primary) hypertension: Secondary | ICD-10-CM

## 2017-12-30 DIAGNOSIS — Z23 Encounter for immunization: Secondary | ICD-10-CM | POA: Diagnosis not present

## 2017-12-30 DIAGNOSIS — Z1211 Encounter for screening for malignant neoplasm of colon: Secondary | ICD-10-CM | POA: Diagnosis not present

## 2017-12-30 DIAGNOSIS — H547 Unspecified visual loss: Secondary | ICD-10-CM

## 2017-12-30 MED ORDER — ATORVASTATIN CALCIUM 10 MG PO TABS
10.0000 mg | ORAL_TABLET | Freq: Every day | ORAL | 1 refills | Status: DC
Start: 2017-12-30 — End: 2018-06-29

## 2017-12-30 MED ORDER — LISINOPRIL-HYDROCHLOROTHIAZIDE 20-12.5 MG PO TABS: 2.0000 | ORAL_TABLET | Freq: Every day | ORAL | 2 refills | Status: DC

## 2017-12-30 MED ORDER — AMLODIPINE BESYLATE 5 MG PO TABS: 5.0000 mg | ORAL_TABLET | Freq: Every day | ORAL | 2 refills | Status: DC

## 2017-12-30 NOTE — Progress Notes (Signed)
Subjective:   By signing my name below, I, [name], attest that this documentation has been prepared under the direction and in the presence of [name].  Electronically Signed: [name], ED Scribe 12/30/2017 at 9:13 AM.     Patient ID: Jonathan Bush, male    DOB: Mar 26, 1961, 57 y.o.   MRN: [id number]  Chief Complaint  Patient presents with  . Annual Exam     HPI Jonathan Bush is a 57 y.o. male who presents to Primary Care at [facility] for his physical. He has h/o HTN and Hyperlipidemia, Hyperglycemia and tobacco abuse.    HTN:  He his on HCTZ, Amlodipine and Lisinopril. He was last seen for HTN in 04/2017.  Denies chest pain or new side effects of medications.   Lab Results  Component Value Date   CREATININE 1.00 05/02/2017    BP Readings from Last 3 Encounters:  12/30/17 (!) 162/72  05/02/17 138/80  09/23/16 (!) 184/88  He notes he has not missed any doses of medication and needs a refill for his medication. He does not adequately check his BP at home, but when he did his BP was in the 134/61-71 range.    Hyperlipidemia:  Lab Results  Component Value Date   ALT 15 05/02/2017   AST 21 05/02/2017   ALKPHOS 85 05/02/2017   BILITOT 0.3 05/02/2017   Lab Results  Component Value Date   CHOL 187 05/02/2017   HDL 27 (L) 05/02/2017   LDLCALC 125 (H) 05/02/2017   TRIG 174 (H) 05/02/2017   CHOLHDL 6.9 (H) 05/02/2017    We recommended filling previous prescription for cholesterol medication. He should be on pravastatin 20mg  qd. He planned to continue on diet and exercise changes as well.  He notes he has not been taking pravastatin as he is worried of the side effects of the medication, he did not fill it. He is willing to try Lipitor.  The 10-year ASCVD risk score Jonathan Bush., et al., 2013) is: 27.1%   Values used to calculate the score:     Age: 52 years     Sex: Male     Is Non-Hispanic African American: No     Diabetic: No     Tobacco smoker: Yes     Systolic Blood Pressure: 162 mmHg     Is BP treated: Yes     HDL Cholesterol: 27 mg/dL     Total Cholesterol: 187 mg/dL   Hyperglycemia Lab Results  Component Value Date   HGBA1C 5.8 (H) 05/02/2017   He is at a level of pre-diabetic with recommended weight loss  Wt Readings from Last 3 Encounters:  12/30/17 221 lb (100.2 kg)  05/02/17 228 lb (103.4 kg)  09/23/16 235 lb (106.6 kg)  He notes he has reduced his diet and playing golf more which has helped him. He notes he would like to lose 10 more pounds.    Tobacco Abuse:  Cessation has been discussed multiple times previously.  He notes he is smoking 1.5 packs a day now.    Cancer Screening:  Colonoscopy is due, prostate cancer testing has not been done recently.  He is willing to be referred to [facility] for colonoscopy. He would like to proceed with Prostate cancer testing today.    Immunization:    There is no immunization history on file for this patient.  He is due for tetanus and shingles vaccines. He deferred shingles vaccine today. He would like  to proceed with tetanus shot today.    Depression Screening  Depression screen [facility] 2/9 12/30/2017 05/02/2017 09/23/2016 12/28/2015 02/06/2015  Decreased Interest 0 0 0 0 0  Down, Depressed, Hopeless 0 0 0 0 0  PHQ - 2 Score 0 0 0 0 0     Vision Screening   Visual Acuity Screening   Right eye Left eye Both eyes  Without correction: 20/40 20/40 20/40   With correction:     He notes he does not wear glasses or contact. He has not seen a ophthalmologist in long time but is willing to go.    Dentist He has seen his dentist in the last 6 months.    Exercise He notes he mostly golfs for exercise and notes he should walk more. He denies SOB with exercise.      Patient Active Problem List   Diagnosis Date Noted  . Essential hypertension 05/02/2017  . Hyperlipidemia 05/02/2017  . Prediabetes 05/02/2017   Past Medical History:  Diagnosis Date  . Hypertension     Past Surgical History:  Procedure Laterality Date  . VASECTOMY     No Known Allergies Prior to Admission medications   Medication Sig Start Date End Date Taking? Authorizing Provider  amLODipine (NORVASC) 2.5 MG tablet Take 1 tablet (2.5 mg total) by mouth daily. 12/03/17  Yes Shade Flood, MD  fish oil-omega-3 fatty acids 1000 MG capsule Take 2 g by mouth daily.   Yes [provider]  lisinopril-hydrochlorothiazide (PRINZIDE,ZESTORETIC) 20-12.5 MG tablet Take 2 tablets by mouth daily. 12/03/17  Yes Shade Flood, MD  Multiple Vitamin (MULTIVITAMIN) tablet Take 1 tablet by mouth daily.    [provider]  pravastatin (PRAVACHOL) 20 MG tablet Take 1 tablet (20 mg total) by mouth daily. Patient not taking: Reported on 12/30/2017 10/03/16   Shade Flood, MD   Social History   Socioeconomic History  . Marital status: Unknown    Spouse name: Not on file  . Number of children: Not on file  . Years of education: Not on file  . Highest education level: Not on file  Occupational History  . Occupation: Investment banker, corporate: [employer]  Social Needs  . Financial resource strain: Not on file  . Food insecurity:    Worry: Not on file    Inability: Not on file  . Transportation needs:    Medical: Not on file    Non-medical: Not on file  Tobacco Use  . Smoking status: Current Every Day Smoker    Packs/day: 1.00    Years: 30.00    Pack years: 30.00    Types: Cigarettes  . Smokeless tobacco: Never Used  Substance and Sexual Activity  . Alcohol use: Yes    Comment: beer  . Drug use: No  . Sexual activity: Yes    Partners: Female  Lifestyle  . Physical activity:    Days per week: Not on file    Minutes per session: Not on file  . Stress: Not on file  Relationships  . Social connections:    Talks on phone: Not on file    Gets together: Not on file    Attends religious service: Not on file    Active member of club or organization: Not on file     Attends meetings of clubs or organizations: Not on file    Relationship status: Not on file  . Intimate partner violence:    Fear of current or ex  partner: Not on file    Emotionally abused: Not on file    Physically abused: Not on file    Forced sexual activity: Not on file  Other Topics Concern  . Not on file  Social History Narrative   Patient walks for exercise.     Review of Systems  Constitutional: Negative.   HENT: Negative.   Eyes: Negative.   Respiratory: Negative.   Cardiovascular: Negative.   Gastrointestinal: Negative.   Endocrine: Negative.   Genitourinary: Negative.   Musculoskeletal: Negative.   Skin: Negative.   Allergic/Immunologic: Negative.   Neurological: Negative.   Hematological: Negative.   Psychiatric/Behavioral: Negative.        Objective:   Physical Exam  Constitutional: He is oriented to person, place, and time. He appears well-developed and well-nourished.  HENT:  Head: Normocephalic and atraumatic.  Right Ear: External ear normal.  Left Ear: External ear normal.  Mouth/Throat: Oropharynx is clear and moist.  Moderate cerum in canal with no obstruction  Eyes: Pupils are equal, round, and reactive to light. Conjunctivae and EOM are normal.  Neck: Normal range of motion. Neck supple. No thyromegaly present.  Cardiovascular: Normal rate, regular rhythm, normal heart sounds and intact distal pulses.  Pulmonary/Chest: Effort normal and breath sounds normal. No respiratory distress. He has no wheezes.  Abdominal: Soft. He exhibits no distension. There is no tenderness. Hernia confirmed negative in the right inguinal area and confirmed negative in the left inguinal area.  Genitourinary: Prostate normal.     Musculoskeletal: Normal range of motion. He exhibits no edema or tenderness.  Lymphadenopathy:    He has no cervical adenopathy.  Neurological: He is alert and oriented to person, place, and time. He has normal reflexes.  Skin: Skin is  warm and dry.  Psychiatric: He has a normal mood and affect. His behavior is normal.  Vitals reviewed.   Vitals:   12/30/17 0909 12/30/17 0943  BP: (!) 174/78 (!) 162/72  Pulse: 75   Resp: 18   Temp: 98.5 F (36.9 C)   TempSrc: Oral   SpO2: 98%   Weight: 221 lb (100.2 kg)   Height: 6\' 1"  (1.854 m)         Assessment & Plan:    ANDREE GOLPHIN is a 57 y.o. male Annual physical exam  - -anticipatory guidance as below in AVS, screening labs above. Health maintenance items as above in HPI discussed/recommended as applicable.   Hyperglycemia, Prediabetes - Plan: Hemoglobin A1c  -Commended on diet changes and weight loss.  Recheck A1c.  Hyperlipidemia, unspecified hyperlipidemia type - Plan: Comprehensive metabolic panel, Lipid panel, atorvastatin (LIPITOR) 10 MG tablet  -Long-standing hyperlipidemia and we have discussed statins multiple times previously as well as his ASCVD risk.  Risk factors of heart disease including hypertension, hyperlipidemia, tobacco abuse, age, and now with prediabetes.  Potential risks and side effects of statins were discussed, but also potential benefits given his cardiac risk factors.  He does want to start Lipitor at this time.  Start 10 mg daily, check labs, recheck in 6-weeks  Special screening for malignant neoplasms, colon - Plan: Ambulatory referral to Gastroenterology  Screening for malignant neoplasm of prostate - Plan: PSA  - We discussed pros and cons of prostate cancer screening, and after this discussion, he chose to have screening done. PSA obtained, and no concerning findings on DRE.   Need for Tdap vaccination - Plan: Tdap vaccine greater than or equal to 7yo IM  Decreased vision - Plan:  Ambulatory referral to Ophthalmology  - referred for eval for corrective lenses and screening  Essential hypertension - Plan: lisinopril-hydrochlorothiazide (PRINZIDE,ZESTORETIC) 20-12.5 MG tablet, amLODipine (NORVASC) 5 MG tablet  - decreased  control. Increased norvasc to 5mg  qd, continue same dose lisinopril HCT. Recheck in 6 weeks. Orthostatic/hypotension precautions.   Meds ordered this encounter  Medications  . atorvastatin (LIPITOR) 10 MG tablet    Sig: Take 1 tablet (10 mg total) by mouth daily.    Dispense:  90 tablet    Refill:  1  . lisinopril-hydrochlorothiazide (PRINZIDE,ZESTORETIC) 20-12.5 MG tablet    Sig: Take 2 tablets by mouth daily.    Dispense:  180 tablet    Refill:  2  . amLODipine (NORVASC) 5 MG tablet    Sig: Take 1 tablet (5 mg total) by mouth daily.    Dispense:  90 tablet    Refill:  2   Patient Instructions    Follow up with eye care provider as discussed. I will refer you.   Keep up the good work with weight loss.  I will check your labs again today.   The 10-year ASCVD risk score Jonathan George(Goff DC Jr., et al., 2013) is: 30.2%.  If that reading is over 8% statins are recommended.  Please start statin as we discussed, but let me know if you have any side effects. Follow up in 6 weeks from starting the statin to see how you are tolerating that med with recheck labs at that time.   Quitting smoking would probably be the most effective change to your health. Let me know if I can help you quit.   Let me know if you change your mind about the shingles vaccine.    Steps to Quit Smoking Smoking tobacco can be harmful to your health and can affect almost every organ in your body. Smoking puts you, and those around you, at risk for developing many serious chronic diseases. Quitting smoking is difficult, but it is one of the best things that you can do for your health. It is never too late to quit. What are the benefits of quitting smoking? When you quit smoking, you lower your risk of developing serious diseases and conditions, such as:  Lung cancer or lung disease, such as COPD.  Heart disease.  Stroke.  Heart attack.  Infertility.  Osteoporosis and bone fractures.  Additionally, symptoms such as  coughing, wheezing, and shortness of breath may get better when you quit. You may also find that you get sick less often because your body is stronger at fighting off colds and infections. If you are pregnant, quitting smoking can help to reduce your chances of having a baby of low birth weight. How do I get ready to quit? When you decide to quit smoking, create a plan to make sure that you are successful. Before you quit:  Pick a date to quit. Set a date within the next two weeks to give you time to prepare.  Write down the reasons why you are quitting. Keep this list in places where you will see it often, such as on your bathroom mirror or in your car or wallet.  Identify the people, places, things, and activities that make you want to smoke (triggers) and avoid them. Make sure to take these actions: ? Throw away all cigarettes at home, at work, and in your car. ? Throw away smoking accessories, such as Set designerashtrays and lighters. ? Clean your car and make sure to empty the ashtray. ?  Clean your home, including curtains and carpets.  Tell your family, friends, and coworkers that you are quitting. Support from your loved ones can make quitting easier.  Talk with your health care provider about your options for quitting smoking.  Find out what treatment options are covered by your health insurance.  What strategies can I use to quit smoking? Talk with your healthcare provider about different strategies to quit smoking. Some strategies include:  Quitting smoking altogether instead of gradually lessening how much you smoke over a period of time. Research shows that quitting "cold [place]" is more successful than gradually quitting.  Attending in-person counseling to help you build problem-solving skills. You are more likely to have success in quitting if you attend several counseling sessions. Even short sessions of 10 minutes can be effective.  Finding resources and support systems that can help  you to quit smoking and remain smoke-free after you quit. These resources are most helpful when you use them often. They can include: ? Online chats with a Veterinary surgeon. ? Telephone quitlines. ? Automotive engineer. ? Support groups or group counseling. ? Text messaging programs. ? Mobile phone applications.  Taking medicines to help you quit smoking. (If you are pregnant or breastfeeding, talk with your health care provider first.) Some medicines contain nicotine and some do not. Both types of medicines help with cravings, but the medicines that include nicotine help to relieve withdrawal symptoms. Your health care provider may recommend: ? Nicotine patches, gum, or lozenges. ? Nicotine inhalers or sprays. ? Non-nicotine medicine that is taken by mouth.  Talk with your health care provider about combining strategies, such as taking medicines while you are also receiving in-person counseling. Using these two strategies together makes you more likely to succeed in quitting than if you used either strategy on its own. If you are pregnant or breastfeeding, talk with your health care provider about finding counseling or other support strategies to quit smoking. Do not take medicine to help you quit smoking unless told to do so by your health care provider. What things can I do to make it easier to quit? Quitting smoking might feel overwhelming at first, but there is a lot that you can do to make it easier. Take these important actions:  Reach out to your family and friends and ask that they support and encourage you during this time. Call telephone quitlines, reach out to support groups, or work with a counselor for support.  Ask people who smoke to avoid smoking around you.  Avoid places that trigger you to smoke, such as bars, parties, or smoke-break areas at work.  Spend time around people who do not smoke.  Lessen stress in your life, because stress can be a smoking trigger for some  people. To lessen stress, try: ? Exercising regularly. ? Deep-breathing exercises. ? Yoga. ? Meditating. ? Performing a body scan. This involves closing your eyes, scanning your body from head to toe, and noticing which parts of your body are particularly tense. Purposefully relax the muscles in those areas.  Download or purchase mobile phone or tablet apps (applications) that can help you stick to your quit plan by providing reminders, tips, and encouragement. There are many free apps, such as QuitGuide from the [employer] developer for Disease Control and Prevention). You can find other support for quitting smoking (smoking cessation) through smokefree.gov and other websites.  How will I feel when I quit smoking? Within the first 24 hours of quitting smoking, you may  start to feel some withdrawal symptoms. These symptoms are usually most noticeable 2-3 days after quitting, but they usually do not last beyond 2-3 weeks. Changes or symptoms that you might experience include:  Mood swings.  Restlessness, anxiety, or irritation.  Difficulty concentrating.  Dizziness.  Strong cravings for sugary foods in addition to nicotine.  Mild weight gain.  Constipation.  Nausea.  Coughing or a sore throat.  Changes in how your medicines work in your body.  A depressed mood.  Difficulty sleeping (insomnia).  After the first 2-3 weeks of quitting, you may start to notice more positive results, such as:  Improved sense of smell and taste.  Decreased coughing and sore throat.  Slower heart rate.  Lower blood pressure.  Clearer skin.  The ability to breathe more easily.  Fewer sick days.  Quitting smoking is very challenging for most people. Do not get discouraged if you are not successful the first time. Some people need to make many attempts to quit before they achieve long-term success. Do your best to stick to your quit plan, and talk with your health care provider if you have any  questions or concerns. This information is not intended to replace advice given to you by your health care provider. Make sure you discuss any questions you have with your health care provider. Document Released: 05/28/2001 Document Revised: 01/30/2016 Document Reviewed: 10/18/2014 Elsevier Interactive Patient Education  2018 [employer].       Keeping you healthy  Get these tests  Blood pressure- Have your blood pressure checked once a year by your healthcare provider.  Normal blood pressure is 120/80  Weight- Have your body mass index (BMI) calculated to screen for obesity.  BMI is a measure of body fat based on height and weight. You can also calculate your own BMI at [email or web address].  Cholesterol- Have your cholesterol checked every year.  Diabetes- Have your blood sugar checked regularly if you have high blood pressure, high cholesterol, have a family history of diabetes or if you are overweight.  Screening for Colon Cancer- Colonoscopy starting at age 40.  Screening may begin sooner depending on your family history and other health conditions. Follow up colonoscopy as directed by your Gastroenterologist.  Screening for Prostate Cancer- Both blood work (PSA) and a rectal exam help screen for Prostate Cancer.  Screening begins at age 63 with African-American men and at age 4 with Caucasian men.  Screening may begin sooner depending on your family history.  Take these medicines  Aspirin- One aspirin daily can help prevent Heart disease and Stroke.  Flu shot- Every fall.  Tetanus- Every 10 years.  Zostavax- Once after the age of 71 to prevent Shingles.  Pneumonia shot- Once after the age of 50; if you are younger than 78, ask your healthcare provider if you need a Pneumonia shot.  Take these steps  Don't smoke- If you do smoke, talk to your doctor about quitting.  For tips on how to quit, go to www.smokefree.gov or call 1-800-QUIT-NOW.  Be physically active-  Exercise 5 days a week for at least 30 minutes.  If you are not already physically active start slow and gradually work up to 30 minutes of moderate physical activity.  Examples of moderate activity include walking briskly, mowing the yard, dancing, swimming, bicycling, etc.  Eat a healthy diet- Eat a variety of healthy food such as fruits, vegetables, low fat milk, low fat cheese, yogurt, lean meant, poultry, fish, beans, tofu, etc. For more  information go to www.thenutritionsource.org  Drink alcohol in moderation- Limit alcohol intake to less than two drinks a day. Never drink and drive.  Dentist- Brush and floss twice daily; visit your dentist twice a year.  Depression- Your emotional health is as important as your physical health. If you're feeling down, or losing interest in things you would normally enjoy please talk to your healthcare provider.  Eye exam- Visit your eye doctor every year.  Safe sex- If you may be exposed to a sexually transmitted infection, use a condom.  Seat belts- Seat belts can save your life; always wear one.  Smoke/Carbon Monoxide detectors- These detectors need to be installed on the appropriate level of your home.  Replace batteries at least once a year.  Skin cancer- When out in the sun, cover up and use sunscreen 15 SPF or higher.  Violence- If anyone is threatening you, please tell your healthcare provider.  Living Will/ Health care power of attorney- Speak with your healthcare provider and family.     IF you received an x-ray today, you will receive an invoice from [facility]. Please contact [facility] at [phone] with questions or concerns regarding your invoice.   IF you received labwork today, you will receive an invoice from [facility]. Please contact [facility] at [phone] with questions or concerns regarding your invoice.   Our billing staff will not be able to assist you with questions regarding bills from these  companies.  You will be contacted with the lab results as soon as they are available. The fastest way to get your results is to activate your My Chart account. Instructions are located on the last page of this paperwork. If you have not heard from [place] regarding the results in 2 weeks, please contact this office.       I personally performed the services described in this documentation, which was scribed in my presence. The recorded information has been reviewed and considered for accuracy and completeness, addended by me as needed, and agree with information above.  Signed,   [name], MD Primary Care at [facility].  12/30/17 10:28 AM

## 2017-12-30 NOTE — Patient Instructions (Addendum)
Follow up with eye care provider as discussed. I will refer you.   Keep up the good work with weight loss.  I will check your labs again today.   The 10-year ASCVD risk score [name] DC Jr., et al., 2013) is: 30.2%.  If that reading is over 8% statins are recommended.  Please start statin as we discussed, but let me know if you have any side effects. Follow up in 6 weeks from starting the statin to see how you are tolerating that med with recheck labs at that time.   Quitting smoking would probably be the most effective change to your health. Let me know if I can help you quit.   Let me know if you change your mind about the shingles vaccine.    Steps to Quit Smoking Smoking tobacco can be harmful to your health and can affect almost every organ in your body. Smoking puts you, and those around you, at risk for developing many serious chronic diseases. Quitting smoking is difficult, but it is one of the best things that you can do for your health. It is never too late to quit. What are the benefits of quitting smoking? When you quit smoking, you lower your risk of developing serious diseases and conditions, such as:  Lung cancer or lung disease, such as COPD.  Heart disease.  Stroke.  Heart attack.  Infertility.  Osteoporosis and bone fractures.  Additionally, symptoms such as coughing, wheezing, and shortness of breath may get better when you quit. You may also find that you get sick less often because your body is stronger at fighting off colds and infections. If you are pregnant, quitting smoking can help to reduce your chances of having a baby of low birth weight. How do I get ready to quit? When you decide to quit smoking, create a plan to make sure that you are successful. Before you quit:  Pick a date to quit. Set a date within the next two weeks to give you time to prepare.  Write down the reasons why you are quitting. Keep this list in places where you will see it often,  such as on your bathroom mirror or in your car or wallet.  Identify the people, places, things, and activities that make you want to smoke (triggers) and avoid them. Make sure to take these actions: ? Throw away all cigarettes at home, at work, and in your car. ? Throw away smoking accessories, such as Set designer. ? Clean your car and make sure to empty the ashtray. ? Clean your home, including curtains and carpets.  Tell your family, friends, and coworkers that you are quitting. Support from your loved ones can make quitting easier.  Talk with your health care provider about your options for quitting smoking.  Find out what treatment options are covered by your health insurance.  What strategies can I use to quit smoking? Talk with your healthcare provider about different strategies to quit smoking. Some strategies include:  Quitting smoking altogether instead of gradually lessening how much you smoke over a period of time. Research shows that quitting "cold [place]" is more successful than gradually quitting.  Attending in-person counseling to help you build problem-solving skills. You are more likely to have success in quitting if you attend several counseling sessions. Even short sessions of 10 minutes can be effective.  Finding resources and support systems that can help you to quit smoking and remain smoke-free after you quit. These resources are  most helpful when you use them often. They can include: ? Online chats with a Veterinary surgeon. ? Telephone quitlines. ? Automotive engineer. ? Support groups or group counseling. ? Text messaging programs. ? Mobile phone applications.  Taking medicines to help you quit smoking. (If you are pregnant or breastfeeding, talk with your health care provider first.) Some medicines contain nicotine and some do not. Both types of medicines help with cravings, but the medicines that include nicotine help to relieve withdrawal symptoms.  Your health care provider may recommend: ? Nicotine patches, gum, or lozenges. ? Nicotine inhalers or sprays. ? Non-nicotine medicine that is taken by mouth.  Talk with your health care provider about combining strategies, such as taking medicines while you are also receiving in-person counseling. Using these two strategies together makes you more likely to succeed in quitting than if you used either strategy on its own. If you are pregnant or breastfeeding, talk with your health care provider about finding counseling or other support strategies to quit smoking. Do not take medicine to help you quit smoking unless told to do so by your health care provider. What things can I do to make it easier to quit? Quitting smoking might feel overwhelming at first, but there is a lot that you can do to make it easier. Take these important actions:  Reach out to your family and friends and ask that they support and encourage you during this time. Call telephone quitlines, reach out to support groups, or work with a counselor for support.  Ask people who smoke to avoid smoking around you.  Avoid places that trigger you to smoke, such as bars, parties, or smoke-break areas at work.  Spend time around people who do not smoke.  Lessen stress in your life, because stress can be a smoking trigger for some people. To lessen stress, try: ? Exercising regularly. ? Deep-breathing exercises. ? Yoga. ? Meditating. ? Performing a body scan. This involves closing your eyes, scanning your body from head to toe, and noticing which parts of your body are particularly tense. Purposefully relax the muscles in those areas.  Download or purchase mobile phone or tablet apps (applications) that can help you stick to your quit plan by providing reminders, tips, and encouragement. There are many free apps, such as QuitGuide from the [employer] developer for Disease Control and Prevention). You can find other support for quitting  smoking (smoking cessation) through smokefree.gov and other websites.  How will I feel when I quit smoking? Within the first 24 hours of quitting smoking, you may start to feel some withdrawal symptoms. These symptoms are usually most noticeable 2-3 days after quitting, but they usually do not last beyond 2-3 weeks. Changes or symptoms that you might experience include:  Mood swings.  Restlessness, anxiety, or irritation.  Difficulty concentrating.  Dizziness.  Strong cravings for sugary foods in addition to nicotine.  Mild weight gain.  Constipation.  Nausea.  Coughing or a sore throat.  Changes in how your medicines work in your body.  A depressed mood.  Difficulty sleeping (insomnia).  After the first 2-3 weeks of quitting, you may start to notice more positive results, such as:  Improved sense of smell and taste.  Decreased coughing and sore throat.  Slower heart rate.  Lower blood pressure.  Clearer skin.  The ability to breathe more easily.  Fewer sick days.  Quitting smoking is very challenging for most people. Do not get discouraged if you are not successful the  first time. Some people need to make many attempts to quit before they achieve long-term success. Do your best to stick to your quit plan, and talk with your health care provider if you have any questions or concerns. This information is not intended to replace advice given to you by your health care provider. Make sure you discuss any questions you have with your health care provider. Document Released: 05/28/2001 Document Revised: 01/30/2016 Document Reviewed: 10/18/2014 Elsevier Interactive Patient Education  2018 [employer].       Keeping you healthy  Get these tests  Blood pressure- Have your blood pressure checked once a year by your healthcare provider.  Normal blood pressure is 120/80  Weight- Have your body mass index (BMI) calculated to screen for obesity.  BMI is a measure of  body fat based on height and weight. You can also calculate your own BMI at [email or web address].  Cholesterol- Have your cholesterol checked every year.  Diabetes- Have your blood sugar checked regularly if you have high blood pressure, high cholesterol, have a family history of diabetes or if you are overweight.  Screening for Colon Cancer- Colonoscopy starting at age 68.  Screening may begin sooner depending on your family history and other health conditions. Follow up colonoscopy as directed by your Gastroenterologist.  Screening for Prostate Cancer- Both blood work (PSA) and a rectal exam help screen for Prostate Cancer.  Screening begins at age 89 with African-American men and at age 46 with Caucasian men.  Screening may begin sooner depending on your family history.  Take these medicines  Aspirin- One aspirin daily can help prevent Heart disease and Stroke.  Flu shot- Every fall.  Tetanus- Every 10 years.  Zostavax- Once after the age of 50 to prevent Shingles.  Pneumonia shot- Once after the age of 70; if you are younger than 66, ask your healthcare provider if you need a Pneumonia shot.  Take these steps  Don't smoke- If you do smoke, talk to your doctor about quitting.  For tips on how to quit, go to www.smokefree.gov or call 1-800-QUIT-NOW.  Be physically active- Exercise 5 days a week for at least 30 minutes.  If you are not already physically active start slow and gradually work up to 30 minutes of moderate physical activity.  Examples of moderate activity include walking briskly, mowing the yard, dancing, swimming, bicycling, etc.  Eat a healthy diet- Eat a variety of healthy food such as fruits, vegetables, low fat milk, low fat cheese, yogurt, lean meant, poultry, fish, beans, tofu, etc. For more information go to www.thenutritionsource.org  Drink alcohol in moderation- Limit alcohol intake to less than two drinks a day. Never drink and drive.  Dentist- Brush and  floss twice daily; visit your dentist twice a year.  Depression- Your emotional health is as important as your physical health. If you're feeling down, or losing interest in things you would normally enjoy please talk to your healthcare provider.  Eye exam- Visit your eye doctor every year.  Safe sex- If you may be exposed to a sexually transmitted infection, use a condom.  Seat belts- Seat belts can save your life; always wear one.  Smoke/Carbon Monoxide detectors- These detectors need to be installed on the appropriate level of your home.  Replace batteries at least once a year.  Skin cancer- When out in the sun, cover up and use sunscreen 15 SPF or higher.  Violence- If anyone is threatening you, please tell your healthcare provider.  Living  Will/ Health care power of attorney- Speak with your healthcare provider and family.     IF you received an x-ray today, you will receive an invoice from [facility]. Please contact [facility] at [phone] with questions or concerns regarding your invoice.   IF you received labwork today, you will receive an invoice from [facility]. Please contact [facility] at [phone] with questions or concerns regarding your invoice.   Our billing staff will not be able to assist you with questions regarding bills from these companies.  You will be contacted with the lab results as soon as they are available. The fastest way to get your results is to activate your My Chart account. Instructions are located on the last page of this paperwork. If you have not heard from us regarding the results in 2 weeks, please contact this office.

## 2017-12-31 ENCOUNTER — Telehealth: Payer: Self-pay | Admitting: Family Medicine

## 2017-12-31 LAB — COMPREHENSIVE METABOLIC PANEL
A/G RATIO: 1.7 (ref 1.2–2.2)
ALBUMIN: 4.8 g/dL (ref 3.5–5.5)
ALK PHOS: 66 IU/L (ref 39–117)
ALT: 11 IU/L (ref 0–44)
AST: 21 IU/L (ref 0–40)
BUN/Creatinine Ratio: 11 (ref 9–20)
BUN: 11 mg/dL (ref 6–24)
Bilirubin Total: 0.4 mg/dL (ref 0.0–1.2)
CO2: 19 mmol/L — AB (ref 20–29)
CREATININE: 0.99 mg/dL (ref 0.76–1.27)
Calcium: 9.3 mg/dL (ref 8.7–10.2)
Chloride: 102 mmol/L (ref 96–106)
GFR calc Af Amer: 99 mL/min/{1.73_m2} (ref >59)
GFR calc non Af Amer: 85 mL/min/{1.73_m2} (ref >59)
GLOBULIN, TOTAL: 2.8 g/dL (ref 1.5–4.5)
Glucose: 112 mg/dL — ABNORMAL HIGH (ref 65–99)
Potassium: 4.1 mmol/L (ref 3.5–5.2)
SODIUM: 138 mmol/L (ref 134–144)
Total Protein: 7.6 g/dL (ref 6.0–8.5)

## 2017-12-31 LAB — LIPID PANEL
CHOL/HDL RATIO: 6.7 ratio — AB (ref 0.0–5.0)
Cholesterol, Total: 208 mg/dL — ABNORMAL HIGH (ref 100–199)
HDL: 31 mg/dL — ABNORMAL LOW (ref >39)
LDL CALC: 128 mg/dL — AB (ref 0–99)
TRIGLYCERIDES: 246 mg/dL — AB (ref 0–149)
VLDL Cholesterol Cal: 49 mg/dL — ABNORMAL HIGH (ref 5–40)

## 2017-12-31 LAB — HEMOGLOBIN A1C
ESTIMATED AVERAGE GLUCOSE: 114 mg/dL
HEMOGLOBIN A1C: 5.6 % (ref 4.8–5.6)

## 2017-12-31 LAB — PSA: Prostate Specific Ag, Serum: 0.5 ng/mL (ref 0.0–4.0)

## 2017-12-31 NOTE — Telephone Encounter (Signed)
Copied from CRM [id number]. Topic: Quick Communication - See Telephone Encounter >> Dec 31, 2017  4:44 PM Arlyss Gandyichardson, Hilde Churchman N, NT wrote: CRM for notification. See Telephone encounter for: 12/31/17. Pt calling and states that the lisinopril sent to the pharmacy is the same dosage the pt has been taking and that Dr. Neva SeatGreene stated he was going to increase the dosage. Pt needs to know how much he should be taking. Please advise.

## 2018-01-01 ENCOUNTER — Other Ambulatory Visit: Payer: Self-pay

## 2018-01-01 DIAGNOSIS — I10 Essential (primary) hypertension: Secondary | ICD-10-CM

## 2018-01-01 MED ORDER — LISINOPRIL-HYDROCHLOROTHIAZIDE 20-12.5 MG PO TABS: 2.0000 | ORAL_TABLET | Freq: Every day | ORAL | 2 refills | Status: DC

## 2018-01-01 NOTE — Telephone Encounter (Signed)
Reviewed chart.  Dr. Neva SeatGreene increased Norvasc to 5mg  - rx sent in. Lisinopril was kept the same at 20/12.5mg .  Spoke with pt - he states Lisinopril was canceled by [facility]??? Unsure why.  Resent Lisinopril.

## 2018-01-28 ENCOUNTER — Other Ambulatory Visit: Payer: Self-pay

## 2018-01-28 ENCOUNTER — Ambulatory Visit: Payer: BLUE CROSS/BLUE SHIELD | Admitting: Emergency Medicine

## 2018-01-28 ENCOUNTER — Ambulatory Visit (INDEPENDENT_AMBULATORY_CARE_PROVIDER_SITE_OTHER): Payer: BLUE CROSS/BLUE SHIELD

## 2018-01-28 ENCOUNTER — Encounter: Payer: Self-pay | Admitting: Emergency Medicine

## 2018-01-28 VITALS — BP 130/72 | HR 78 | Temp 98.0°F | Resp 16 | Ht 73.0 in | Wt 221.0 lb

## 2018-01-28 DIAGNOSIS — M545 Low back pain, unspecified: Secondary | ICD-10-CM | POA: Insufficient documentation

## 2018-01-28 DIAGNOSIS — M7918 Myalgia, other site: Secondary | ICD-10-CM | POA: Diagnosis not present

## 2018-01-28 DIAGNOSIS — M5432 Sciatica, left side: Secondary | ICD-10-CM

## 2018-01-28 DIAGNOSIS — M62838 Other muscle spasm: Secondary | ICD-10-CM | POA: Insufficient documentation

## 2018-01-28 MED ORDER — METHYLPREDNISOLONE 4 MG PO TBPK: ORAL_TABLET | ORAL | 1 refills | Status: DC

## 2018-01-28 MED ORDER — CYCLOBENZAPRINE HCL 10 MG PO TABS: 10.0000 mg | ORAL_TABLET | Freq: Every day | ORAL | 0 refills | Status: AC

## 2018-01-28 NOTE — Patient Instructions (Addendum)
I will contact you with your lab results within the next 2 weeks.  If you have not heard from us then please contact us. The fastest way to get your results is to register for My Chart.   IF you received an x-ray today, you will receive an invoice from [facility]. Please contact [facility] at [phone] with questions or concerns regarding your invoice.   IF you received labwork today, you will receive an invoice from [facility]. Please contact [facility] at [phone] with questions or concerns regarding your invoice.   Our billing staff will not be able to assist you with questions regarding bills from these companies.  You will be contacted with the lab results as soon as they are available. The fastest way to get your results is to activate your My Chart account. Instructions are located on the last page of this paperwork. If you have not heard from us regarding the results in 2 weeks, please contact this office.      Sciatica Sciatica is pain, numbness, weakness, or tingling along your sciatic nerve. The sciatic nerve starts in the lower back and goes down the back of each leg. Sciatica happens when this nerve is pinched or has pressure put on it. Sciatica usually goes away on its own or with treatment. Sometimes, sciatica may keep coming back (recur). Follow these instructions at home: Medicines  Take over-the-counter and prescription medicines only as told by your doctor.  Do not drive or use heavy machinery while taking prescription pain medicine. Managing pain  If directed, put ice on the affected area. ? Put ice in a plastic bag. ? Place a towel between your skin and the bag. ? Leave the ice on for 20 minutes, 2-3 times a day.  After icing, apply heat to the affected area before you exercise or as often as told by your doctor. Use the heat source that your doctor tells you to use, such as a moist heat pack or a heating pad. ? Place a towel between  your skin and the heat source. ? Leave the heat on for 20-30 minutes. ? Remove the heat if your skin turns bright red. This is especially important if you are unable to feel pain, heat, or cold. You may have a greater risk of getting burned. Activity  Return to your normal activities as told by your doctor. Ask your doctor what activities are safe for you. ? Avoid activities that make your sciatica worse.  Take short rests during the day. Rest in a lying or standing position. This is usually better than sitting to rest. ? When you rest for a long time, do some physical activity or stretching between periods of rest. ? Avoid sitting for a long time without moving. Get up and move around at least one time each hour.  Exercise and stretch regularly, as told by your doctor.  Do not lift anything that is heavier than 10 lb (4.5 kg) while you have symptoms of sciatica. ? Avoid lifting heavy things even when you do not have symptoms. ? Avoid lifting heavy things over and over.  When you lift objects, always lift in a way that is safe for your body. To do this, you should: ? Bend your knees. ? Keep the object close to your body. ? Avoid twisting. General instructions  Use good posture. ? Avoid leaning forward when you are sitting. ? Avoid hunching over when you are standing.  Stay at a healthy  weight.  Wear comfortable shoes that support your feet. Avoid wearing high heels.  Avoid sleeping on a mattress that is too soft or too hard. You might have less pain if you sleep on a mattress that is firm enough to support your back.  Keep all follow-up visits as told by your doctor. This is important. Contact a doctor if:  You have pain that: ? Wakes you up when you are sleeping. ? Gets worse when you lie down. ? Is worse than the pain you have had in the past. ? Lasts longer than 4 weeks.  You lose weight for without trying. Get help right away if:  You cannot control when you pee  (urinate) or poop (have a bowel movement).  You have weakness in any of these areas and it gets worse. ? Lower back. ? Lower belly (pelvis). ? Butt (buttocks). ? Legs.  You have redness or swelling of your back.  You have a burning feeling when you pee. This information is not intended to replace advice given to you by your health care provider. Make sure you discuss any questions you have with your health care provider. Document Released: 03/12/2008 Document Revised: 11/09/2015 Document Reviewed: 02/10/2015 Elsevier Interactive Patient Education  [employer]2018 Elsevier Inc.

## 2018-01-28 NOTE — Progress Notes (Signed)
Jonathan Bush 57 y.o.   Chief Complaint  Patient presents with  . Back Pain    pt states for the last 2 weeks he has been having some sciatica pain going down the left side starting in the hip      HISTORY OF PRESENT ILLNESS: This is a 57 y.o. male complaining of 2-week history of left-sided lumbar pain on and off.  Played golf last Saturday and felt fine but 2 days later started having pain with radiation down the back of the left leg, worse today.  Took meloxicam and it helped.  Denies neurological symptoms.  Denies bladder or bowel issues.  Had a similar episode last January for the first time ever.  No chronic history.  Denies abdominal pain or urinary symptoms.  Ambulatory reports.  Pain is sharp and constant, better with rest, worse with movement.  HPI   Prior to Admission medications   Medication Sig Start Date End Date Taking? Authorizing Provider  amLODipine (NORVASC) 5 MG tablet Take 1 tablet (5 mg total) by mouth daily. 12/30/17  Yes Shade Flood, MD  atorvastatin (LIPITOR) 10 MG tablet Take 1 tablet (10 mg total) by mouth daily. 12/30/17  Yes Shade Flood, MD  fish oil-omega-3 fatty acids 1000 MG capsule Take 2 g by mouth daily.   Yes [provider]  glucosamine-chondroitin 500-400 MG tablet Take 1 tablet by mouth 3 (three) times daily.   Yes [provider]  lisinopril-hydrochlorothiazide (PRINZIDE,ZESTORETIC) 20-12.5 MG tablet Take 2 tablets by mouth daily. 01/01/18  Yes Shade Flood, MD  Multiple Vitamin (MULTIVITAMIN) tablet Take 1 tablet by mouth daily.   Yes [provider]    No Known Allergies  Patient Active Problem List   Diagnosis Date Noted  . Essential hypertension 05/02/2017  . Hyperlipidemia 05/02/2017  . Prediabetes 05/02/2017    Past Medical History:  Diagnosis Date  . Hypertension     Past Surgical History:  Procedure Laterality Date  . VASECTOMY      Social History   Socioeconomic History  .  Marital status: Unknown    Spouse name: Not on file  . Number of children: Not on file  . Years of education: Not on file  . Highest education level: Not on file  Occupational History  . Occupation: Investment banker, corporate: [employer]  Social Needs  . Financial resource strain: Not on file  . Food insecurity:    Worry: Not on file    Inability: Not on file  . Transportation needs:    Medical: Not on file    Non-medical: Not on file  Tobacco Use  . Smoking status: Current Every Day Smoker    Packs/day: 1.00    Years: 30.00    Pack years: 30.00    Types: Cigarettes  . Smokeless tobacco: Never Used  Substance and Sexual Activity  . Alcohol use: Yes    Comment: beer  . Drug use: No  . Sexual activity: Yes    Partners: Female  Lifestyle  . Physical activity:    Days per week: Not on file    Minutes per session: Not on file  . Stress: Not on file  Relationships  . Social connections:    Talks on phone: Not on file    Gets together: Not on file    Attends religious service: Not on file    Active member of club or organization: Not on file    Attends meetings of  clubs or organizations: Not on file    Relationship status: Not on file  . Intimate partner violence:    Fear of current or ex partner: Not on file    Emotionally abused: Not on file    Physically abused: Not on file    Forced sexual activity: Not on file  Other Topics Concern  . Not on file  Social History Narrative   Patient walks for exercise.    Family History  Problem Relation Age of Onset  . Cancer Father   . Hypertension Brother      Review of Systems  Constitutional: Negative.  Negative for chills and fever.  HENT: Negative.  Negative for sore throat.   Eyes: Negative.  Negative for blurred vision and double vision.  Respiratory: Negative.  Negative for cough and shortness of breath.   Cardiovascular: Negative.  Negative for chest pain and palpitations.  Gastrointestinal: Negative.  Negative for  abdominal pain, blood in stool, diarrhea, nausea and vomiting.  Genitourinary: Negative.  Negative for dysuria, flank pain and hematuria.  Musculoskeletal: Positive for back pain. Negative for myalgias and neck pain.  Skin: Negative.  Negative for rash.  Neurological: Negative.  Negative for dizziness, sensory change, focal weakness, loss of consciousness and headaches.  Endo/Heme/Allergies: Negative.   All other systems reviewed and are negative.  Vitals:   01/28/18 0947  BP: 130/72  Pulse: 78  Resp: 16  Temp: 98 F (36.7 C)  SpO2: 98%     Physical Exam  Constitutional: He is oriented to person, place, and time. He appears well-developed and well-nourished.  HENT:  Head: Normocephalic and atraumatic.  Nose: Nose normal.  Mouth/Throat: Oropharynx is clear and moist.  Eyes: Pupils are equal, round, and reactive to light. Conjunctivae and EOM are normal.  Neck: Normal range of motion.  Cardiovascular: Normal rate and regular rhythm.  Pulmonary/Chest: Effort normal and breath sounds normal.  Abdominal: Soft. Bowel sounds are normal. He exhibits no distension. There is no tenderness.  Musculoskeletal: He exhibits no edema.       Lumbar back: He exhibits decreased range of motion, tenderness and spasm. He exhibits no bony tenderness and normal pulse.  Neurological: He is alert and oriented to person, place, and time. No sensory deficit. He exhibits normal muscle tone.  Skin: Skin is warm and dry. Capillary refill takes less than 2 seconds. No rash noted.  Psychiatric: He has a normal mood and affect. His behavior is normal.  Vitals reviewed.  Dg Lumbar Spine 2-3 Views  Result Date: 01/28/2018 CLINICAL DATA:  Low back pain and left-sided sciatica. EXAM: LUMBAR SPINE - 2-3 VIEW COMPARISON:  None. FINDINGS: No fracture or spondylolisthesis is noted. Disc spaces appear to be well maintained. Minimal anterior osteophyte formation is noted at L3-4 and L4-5. Atherosclerosis of abdominal  aorta is noted. IMPRESSION: Minimal degenerative changes as described above. No acute abnormality seen in the lumbar spine. Aortic Atherosclerosis (ICD10-I70.0). Electronically Signed   By: Lupita Raider, M.D.   On: 01/28/2018 10:49   A total of 25 minutes was spent in the room with the patient, greater than 50% of which was in counseling/coordination of care regarding differential diagnosis, management, medications, and need for follow-up if no better or worse.  X-ray reviewed with patient and wife in the room.   ASSESSMENT & PLAN: Jonathan Bush was seen today for back pain.  Diagnoses and all orders for this visit:  Lumbar pain -     DG Lumbar Spine 2-3 Views;  Future -     methylPREDNISolone (MEDROL DOSEPAK) 4 MG TBPK tablet; Sig as indicated -     cyclobenzaprine (FLEXERIL) 10 MG tablet; Take 1 tablet (10 mg total) by mouth at bedtime for 5 days.  Sciatica of left side -     DG Lumbar Spine 2-3 Views; Future -     methylPREDNISolone (MEDROL DOSEPAK) 4 MG TBPK tablet; Sig as indicated -     cyclobenzaprine (FLEXERIL) 10 MG tablet; Take 1 tablet (10 mg total) by mouth at bedtime for 5 days.  Musculoskeletal pain  Muscle spasm    Patient Instructions       I will contact you with your lab results within the next 2 weeks.  If you have not heard from us then please contact us. The fastest way to get your results is to register for My Chart.   IF you received an x-ray today, you will receive an invoice from [facility]. Please contact [facility] at [phone] with questions or concerns regarding your invoice.   IF you received labwork today, you will receive an invoice from [facility]. Please contact [facility] at [phone] with questions or concerns regarding your invoice.   Our billing staff will not be able to assist you with questions regarding bills from these companies.  You will be contacted with the lab results as soon as they are available. The fastest  way to get your results is to activate your My Chart account. Instructions are located on the last page of this paperwork. If you have not heard from us regarding the results in 2 weeks, please contact this office.      Sciatica Sciatica is pain, numbness, weakness, or tingling along your sciatic nerve. The sciatic nerve starts in the lower back and goes down the back of each leg. Sciatica happens when this nerve is pinched or has pressure put on it. Sciatica usually goes away on its own or with treatment. Sometimes, sciatica may keep coming back (recur). Follow these instructions at home: Medicines  Take over-the-counter and prescription medicines only as told by your doctor.  Do not drive or use heavy machinery while taking prescription pain medicine. Managing pain  If directed, put ice on the affected area. ? Put ice in a plastic bag. ? Place a towel between your skin and the bag. ? Leave the ice on for 20 minutes, 2-3 times a day.  After icing, apply heat to the affected area before you exercise or as often as told by your doctor. Use the heat source that your doctor tells you to use, such as a moist heat pack or a heating pad. ? Place a towel between your skin and the heat source. ? Leave the heat on for 20-30 minutes. ? Remove the heat if your skin turns bright red. This is especially important if you are unable to feel pain, heat, or cold. You may have a greater risk of getting burned. Activity  Return to your normal activities as told by your doctor. Ask your doctor what activities are safe for you. ? Avoid activities that make your sciatica worse.  Take short rests during the day. Rest in a lying or standing position. This is usually better than sitting to rest. ? When you rest for a long time, do some physical activity or stretching between periods of rest. ? Avoid sitting for a long time without moving. Get up and move around at least one time each hour.  Exercise and  stretch  regularly, as told by your doctor.  Do not lift anything that is heavier than 10 lb (4.5 kg) while you have symptoms of sciatica. ? Avoid lifting heavy things even when you do not have symptoms. ? Avoid lifting heavy things over and over.  When you lift objects, always lift in a way that is safe for your body. To do this, you should: ? Bend your knees. ? Keep the object close to your body. ? Avoid twisting. General instructions  Use good posture. ? Avoid leaning forward when you are sitting. ? Avoid hunching over when you are standing.  Stay at a healthy weight.  Wear comfortable shoes that support your feet. Avoid wearing high heels.  Avoid sleeping on a mattress that is too soft or too hard. You might have less pain if you sleep on a mattress that is firm enough to support your back.  Keep all follow-up visits as told by your doctor. This is important. Contact a doctor if:  You have pain that: ? Wakes you up when you are sleeping. ? Gets worse when you lie down. ? Is worse than the pain you have had in the past. ? Lasts longer than 4 weeks.  You lose weight for without trying. Get help right away if:  You cannot control when you pee (urinate) or poop (have a bowel movement).  You have weakness in any of these areas and it gets worse. ? Lower back. ? Lower belly (pelvis). ? Butt (buttocks). ? Legs.  You have redness or swelling of your back.  You have a burning feeling when you pee. This information is not intended to replace advice given to you by your health care provider. Make sure you discuss any questions you have with your health care provider. Document Released: 03/12/2008 Document Revised: 11/09/2015 Document Reviewed: 02/10/2015 Elsevier Interactive Patient Education  2018 [employer]      Edwina BarthMiguel Adilynne Fitzwater, MD [facility]

## 2018-02-12 ENCOUNTER — Encounter: Payer: Self-pay | Admitting: Family Medicine

## 2018-02-12 ENCOUNTER — Ambulatory Visit (INDEPENDENT_AMBULATORY_CARE_PROVIDER_SITE_OTHER): Payer: BLUE CROSS/BLUE SHIELD | Admitting: Family Medicine

## 2018-02-12 ENCOUNTER — Telehealth: Payer: Self-pay | Admitting: Family Medicine

## 2018-02-12 ENCOUNTER — Other Ambulatory Visit: Payer: Self-pay

## 2018-02-12 VITALS — BP 136/73 | HR 75 | Temp 98.4°F | Ht 72.0 in | Wt 223.0 lb

## 2018-02-12 DIAGNOSIS — M5442 Lumbago with sciatica, left side: Secondary | ICD-10-CM

## 2018-02-12 DIAGNOSIS — M5432 Sciatica, left side: Secondary | ICD-10-CM | POA: Diagnosis not present

## 2018-02-12 DIAGNOSIS — E785 Hyperlipidemia, unspecified: Secondary | ICD-10-CM | POA: Diagnosis not present

## 2018-02-12 LAB — LIPID PANEL
CHOLESTEROL TOTAL: 141 mg/dL (ref 100–199)
Chol/HDL Ratio: 4.7 ratio (ref 0.0–5.0)
HDL: 30 mg/dL — AB (ref >39)
LDL Calculated: 79 mg/dL (ref 0–99)
TRIGLYCERIDES: 162 mg/dL — AB (ref 0–149)
VLDL CHOLESTEROL CAL: 32 mg/dL (ref 5–40)

## 2018-02-12 LAB — COMPREHENSIVE METABOLIC PANEL
ALBUMIN: 4.6 g/dL (ref 3.5–5.5)
ALK PHOS: 72 IU/L (ref 39–117)
ALT: 15 IU/L (ref 0–44)
AST: 21 IU/L (ref 0–40)
Albumin/Globulin Ratio: 1.8 (ref 1.2–2.2)
BILIRUBIN TOTAL: 0.3 mg/dL (ref 0.0–1.2)
BUN / CREAT RATIO: 15 (ref 9–20)
BUN: 13 mg/dL (ref 6–24)
CO2: 23 mmol/L (ref 20–29)
Calcium: 9.9 mg/dL (ref 8.7–10.2)
Chloride: 100 mmol/L (ref 96–106)
Creatinine, Ser: 0.89 mg/dL (ref 0.76–1.27)
GFR calc Af Amer: 111 mL/min/{1.73_m2} (ref >59)
GFR calc non Af Amer: 96 mL/min/{1.73_m2} (ref >59)
GLOBULIN, TOTAL: 2.5 g/dL (ref 1.5–4.5)
Glucose: 104 mg/dL — ABNORMAL HIGH (ref 65–99)
Potassium: 4.3 mmol/L (ref 3.5–5.2)
SODIUM: 137 mmol/L (ref 134–144)
Total Protein: 7.1 g/dL (ref 6.0–8.5)

## 2018-02-12 NOTE — Progress Notes (Signed)
Subjective:    Patient ID: Jonathan Bush, male    DOB: Oct 16, 1960, 57 y.o.   MRN: [id number]  HPI Jonathan Bush is a 57 y.o. male Presents today for: Chief Complaint  Patient presents with  . Hyperlipidemia    recheck    Hyperlipidemia:  Lab Results  Component Value Date   CHOL 208 (H) 12/30/2017   HDL 31 (L) 12/30/2017   LDLCALC 128 (H) 12/30/2017   TRIG 246 (H) 12/30/2017   CHOLHDL 6.7 (H) 12/30/2017   Lab Results  Component Value Date   ALT 11 12/30/2017   AST 21 12/30/2017   ALKPHOS 66 12/30/2017   BILITOT 0.4 12/30/2017   I last saw him July 16.  We had discussed statin based on elevated ASCVD risk previously.  He was willing to try Lipitor at that visit.  Start Lipitor 10 mg daily. Fasting today. No new side effects on Lipitor, and taking daily.    SCiatica/low back pain: Recently treated for sciatica. Seen 01/28/18 - treated with prednisone taper. Helped pain. Minimal discomfort in left buttocks and slight numbness in leg. Has held back on golfing. No bowel or bladder incontinence, no saddle anesthesia, no lower extremity weakness. Wondering about return to golf. Not needing flexeril,min symptoms.    Patient Active Problem List   Diagnosis Date Noted  . Lumbar pain 01/28/2018  . Sciatica of left side 01/28/2018  . Musculoskeletal pain 01/28/2018  . Muscle spasm 01/28/2018  . Essential hypertension 05/02/2017  . Hyperlipidemia 05/02/2017  . Prediabetes 05/02/2017   Past Medical History:  Diagnosis Date  . Hypertension    Past Surgical History:  Procedure Laterality Date  . VASECTOMY     No Known Allergies Prior to Admission medications   Medication Sig Start Date End Date Taking? Authorizing Provider  amLODipine (NORVASC) 5 MG tablet Take 1 tablet (5 mg total) by mouth daily. 12/30/17  Yes Shade Flood, MD  atorvastatin (LIPITOR) 10 MG tablet Take 1 tablet (10 mg total) by mouth daily. 12/30/17  Yes Shade Flood, MD  fish oil-omega-3 fatty  acids 1000 MG capsule Take 2 g by mouth daily.   Yes [provider]  glucosamine-chondroitin 500-400 MG tablet Take 1 tablet by mouth 3 (three) times daily.   Yes [provider]  lisinopril-hydrochlorothiazide (PRINZIDE,ZESTORETIC) 20-12.5 MG tablet Take 2 tablets by mouth daily. 01/01/18  Yes Shade Flood, MD  methylPREDNISolone (MEDROL DOSEPAK) 4 MG TBPK tablet Sig as indicated 01/28/18  Yes Sagardia, Eilleen Kempf, MD  Multiple Vitamin (MULTIVITAMIN) tablet Take 1 tablet by mouth daily.   Yes [provider]   Social History   Socioeconomic History  . Marital status: Unknown    Spouse name: Not on file  . Number of children: Not on file  . Years of education: Not on file  . Highest education level: Not on file  Occupational History  . Occupation: Investment banker, corporate: [employer]  Social Needs  . Financial resource strain: Not on file  . Food insecurity:    Worry: Not on file    Inability: Not on file  . Transportation needs:    Medical: Not on file    Non-medical: Not on file  Tobacco Use  . Smoking status: Current Every Day Smoker    Packs/day: 1.00    Years: 30.00    Pack years: 30.00    Types: Cigarettes  . Smokeless tobacco: Never Used  Substance and Sexual Activity  .  Alcohol use: Yes    Comment: beer  . Drug use: No  . Sexual activity: Yes    Partners: Female  Lifestyle  . Physical activity:    Days per week: Not on file    Minutes per session: Not on file  . Stress: Not on file  Relationships  . Social connections:    Talks on phone: Not on file    Gets together: Not on file    Attends religious service: Not on file    Active member of club or organization: Not on file    Attends meetings of clubs or organizations: Not on file    Relationship status: Not on file  . Intimate partner violence:    Fear of current or ex partner: Not on file    Emotionally abused: Not on file    Physically abused: Not on file    Forced sexual  activity: Not on file  Other Topics Concern  . Not on file  Social History Narrative   Patient walks for exercise.    Review of Systems     Objective:   Physical Exam  Constitutional: He is oriented to person, place, and time. He appears well-developed and well-nourished.  HENT:  Head: Normocephalic and atraumatic.  Eyes: Pupils are equal, round, and reactive to light. EOM are normal.  Neck: No JVD present. Carotid bruit is not present.  Cardiovascular: Normal rate, regular rhythm and normal heart sounds.  No murmur heard. Pulmonary/Chest: Effort normal and breath sounds normal. He has no rales.  Musculoskeletal: Normal range of motion. He exhibits no edema or tenderness.  Negative seated SLR, min ttp over left sciatic notch, able to heel/toe walk without difficulty.   Neurological: He is alert and oriented to person, place, and time.  Skin: Skin is warm and dry.  Psychiatric: He has a normal mood and affect.  Vitals reviewed.  Vitals:   02/12/18 0828  BP: 136/73  Pulse: 75  Temp: 98.4 F (36.9 C)  TempSrc: Oral  SpO2: 98%  Weight: 223 lb (101.2 kg)  Height: 6' (1.829 m)   Patient Active Problem List   Diagnosis Date Noted  . Lumbar pain 01/28/2018  . Sciatica of left side 01/28/2018  . Musculoskeletal pain 01/28/2018  . Muscle spasm 01/28/2018  . Essential hypertension 05/02/2017  . Hyperlipidemia 05/02/2017  . Prediabetes 05/02/2017   Past Medical History:  Diagnosis Date  . Hypertension    Past Surgical History:  Procedure Laterality Date  . VASECTOMY     No Known Allergies Prior to Admission medications   Medication Sig Start Date End Date Taking? Authorizing Provider  amLODipine (NORVASC) 5 MG tablet Take 1 tablet (5 mg total) by mouth daily. 12/30/17  Yes Shade Flood, MD  atorvastatin (LIPITOR) 10 MG tablet Take 1 tablet (10 mg total) by mouth daily. 12/30/17  Yes Shade Flood, MD  fish oil-omega-3 fatty acids 1000 MG capsule Take 2 g by  mouth daily.   Yes [provider]  glucosamine-chondroitin 500-400 MG tablet Take 1 tablet by mouth 3 (three) times daily.   Yes [provider]  lisinopril-hydrochlorothiazide (PRINZIDE,ZESTORETIC) 20-12.5 MG tablet Take 2 tablets by mouth daily. 01/01/18  Yes Shade Flood, MD  methylPREDNISolone (MEDROL DOSEPAK) 4 MG TBPK tablet Sig as indicated 01/28/18  Yes Sagardia, Eilleen Kempf, MD  Multiple Vitamin (MULTIVITAMIN) tablet Take 1 tablet by mouth daily.   Yes [provider]   Social History   Socioeconomic History  .  Marital status: Unknown    Spouse name: Not on file  . Number of children: Not on file  . Years of education: Not on file  . Highest education level: Not on file  Occupational History  . Occupation: Investment banker, corporate: [employer]  Social Needs  . Financial resource strain: Not on file  . Food insecurity:    Worry: Not on file    Inability: Not on file  . Transportation needs:    Medical: Not on file    Non-medical: Not on file  Tobacco Use  . Smoking status: Current Every Day Smoker    Packs/day: 1.00    Years: 30.00    Pack years: 30.00    Types: Cigarettes  . Smokeless tobacco: Never Used  Substance and Sexual Activity  . Alcohol use: Yes    Comment: beer  . Drug use: No  . Sexual activity: Yes    Partners: Female  Lifestyle  . Physical activity:    Days per week: Not on file    Minutes per session: Not on file  . Stress: Not on file  Relationships  . Social connections:    Talks on phone: Not on file    Gets together: Not on file    Attends religious service: Not on file    Active member of club or organization: Not on file    Attends meetings of clubs or organizations: Not on file    Relationship status: Not on file  . Intimate partner violence:    Fear of current or ex partner: Not on file    Emotionally abused: Not on file    Physically abused: Not on file    Forced sexual activity: Not on file  Other Topics  Concern  . Not on file  Social History Narrative   Patient walks for exercise.      Assessment & Plan:   SAHAS SLUKA is a 57 y.o. male Hyperlipidemia, unspecified hyperlipidemia type - Plan: Lipid panel, Comprehensive metabolic panel  -Tolerating Lipitor, check labs.  No changes for now, recheck within the next 4 to 5 months  Left sided sciatica  -Significantly improving.  Slowly return to activity, exercise handout was given, discussed initial return to sport and RTC precautions.  No orders of the defined types were placed in this encounter.  Patient Instructions   I will check cholesterol readings today. No change in Lipitor dose for now.   Sciatica symptoms should continue to improve.  See information below on a few exercises you can try.   Slowly increase exercises as tolerated, and be cautious with forceful swings when returning to golf.   Return to the clinic or go to the nearest emergency room if any of your symptoms worsen or new symptoms occur.   Sciatica Rehab Ask your health care provider which exercises are safe for you. Do exercises exactly as told by your health care provider and adjust them as directed. It is normal to feel mild stretching, pulling, tightness, or discomfort as you do these exercises, but you should stop right away if you feel sudden pain or your pain gets worse.Do not begin these exercises until told by your health care provider. Stretching and range of motion exercises These exercises warm up your muscles and joints and improve the movement and flexibility of your hips and your back. These exercises also help to relieve pain, numbness, and tingling. Exercise A: Sciatic nerve glide 1. Sit in a chair with your  head facing down toward your chest. Place your hands behind your back. Let your shoulders slump forward. 2. Slowly straighten one of your knees while you tilt your head back as if you are looking toward the ceiling. Only straighten your leg as  far as you can without making your symptoms worse. 3. Hold for _________ seconds. 4. Slowly return to the starting position. 5. Repeat with your other leg. Repeat __________ times. Complete this exercise __________ times a day. Exercise B: Knee to chest with hip adduction and internal rotation  1. Lie on your back on a firm surface with both legs straight. 2. Bend one of your knees and move it up toward your chest until you feel a gentle stretch in your lower back and buttock. Then, move your knee toward the shoulder that is on the opposite side from your leg. ? Hold your leg in this position by holding onto the front of your knee. 3. Hold for __________ seconds. 4. Slowly return to the starting position. 5. Repeat with your other leg. Repeat __________ times. Complete this exercise __________ times a day. Exercise C: Prone extension on elbows  1. Lie on your abdomen on a firm surface. A bed may be too soft for this exercise. 2. Prop yourself up on your elbows. 3. Use your arms to help lift your chest up until you feel a gentle stretch in your abdomen and your lower back. ? This will place some of your body weight on your elbows. If this is uncomfortable, try stacking pillows under your chest. ? Your hips should stay down, against the surface that you are lying on. Keep your hip and back muscles relaxed. 4. Hold for __________ seconds. 5. Slowly relax your upper body and return to the starting position. Repeat __________ times. Complete this exercise __________ times a day. Strengthening exercises These exercises build strength and endurance in your back. Endurance is the ability to use your muscles for a long time, even after they get tired. Exercise D: Pelvic tilt 1. Lie on your back on a firm surface. Bend your knees and keep your feet flat. 2. Tense your abdominal muscles. Tip your pelvis up toward the ceiling and flatten your lower back into the floor. ? To help with this exercise,  you may place a small towel under your lower back and try to push your back into the towel. 3. Hold for __________ seconds. 4. Let your muscles relax completely before you repeat this exercise. Repeat __________ times. Complete this exercise __________ times a day. Exercise E: Alternating arm and leg raises  1. Get on your hands and knees on a firm surface. If you are on a hard floor, you may want to use padding to cushion your knees, such as an exercise mat. 2. Line up your arms and legs. Your hands should be below your shoulders, and your knees should be below your hips. 3. Lift your left leg behind you. At the same time, raise your right arm and straighten it in front of you. ? Do not lift your leg higher than your hip. ? Do not lift your arm higher than your shoulder. ? Keep your abdominal and back muscles tight. ? Keep your hips facing the ground. ? Do not arch your back. ? Keep your balance carefully, and do not hold your breath. 4. Hold for __________ seconds. 5. Slowly return to the starting position and repeat with your right leg and your left arm. Repeat __________ times. Complete this  exercise __________ times a day. Posture and body mechanics  Body mechanics refers to the movements and positions of your body while you do your daily activities. Posture is part of body mechanics. Good posture and healthy body mechanics can help to relieve stress in your body's tissues and joints. Good posture means that your spine is in its natural S-curve position (your spine is neutral), your shoulders are pulled back slightly, and your head is not tipped forward. The following are general guidelines for applying improved posture and body mechanics to your everyday activities. Standing   When standing, keep your spine neutral and your feet about hip-width apart. Keep a slight bend in your knees. Your ears, shoulders, and hips should line up.  When you do a task in which you stand in one place  for a long time, place one foot up on a stable object that is 2-4 inches (5-10 cm) high, such as a footstool. This helps keep your spine neutral. Sitting   When sitting, keep your spine neutral and keep your feet flat on the floor. Use a footrest, if necessary, and keep your thighs parallel to the floor. Avoid rounding your shoulders, and avoid tilting your head forward.  When working at a desk or a computer, keep your desk at a height where your hands are slightly lower than your elbows. Slide your chair under your desk so you are close enough to maintain good posture.  When working at a computer, place your monitor at a height where you are looking straight ahead and you do not have to tilt your head forward or downward to look at the screen. Resting   When lying down and resting, avoid positions that are most painful for you.  If you have pain with activities such as sitting, bending, stooping, or squatting (flexion-based activities), lie in a position in which your body does not bend very much. For example, avoid curling up on your side with your arms and knees near your chest (fetal position).  If you have pain with activities such as standing for a long time or reaching with your arms (extension-based activities), lie with your spine in a neutral position and bend your knees slightly. Try the following positions: ? Lying on your side with a pillow between your knees. ? Lying on your back with a pillow under your knees. Lifting   When lifting objects, keep your feet at least shoulder-width apart and tighten your abdominal muscles.  Bend your knees and hips and keep your spine neutral. It is important to lift using the strength of your legs, not your back. Do not lock your knees straight out.  Always ask for help to lift heavy or awkward objects. This information is not intended to replace advice given to you by your health care provider. Make sure you discuss any questions you have  with your health care provider. Document Released: 06/03/2005 Document Revised: 02/08/2016 Document Reviewed: 02/17/2015 Elsevier Interactive Patient Education  [employer].      If you have lab work done today you will be contacted with your lab results within the next 2 weeks.  If you have not heard from [place] then please contact us. The fastest way to get your results is to register for My Chart.   IF you received an x-ray today, you will receive an invoice from [facility]. Please contact [facility] at [phone] with questions or concerns regarding your invoice.   IF you received labwork today, you  will receive an invoice from [place]. Please contact [facility] at [phone] with questions or concerns regarding your invoice.   Our billing staff will not be able to assist you with questions regarding bills from these companies.  You will be contacted with the lab results as soon as they are available. The fastest way to get your results is to activate your My Chart account. Instructions are located on the last page of this paperwork. If you have not heard from [place] regarding the results in 2 weeks, please contact this office.       Signed,   [name], MD [facility].  02/12/18 9:24 AM

## 2018-02-12 NOTE — Telephone Encounter (Signed)
Copied from CRM [id number]. Topic: Referral - Request >> Feb 12, 2018  4:35 PM Arlyss Gandyichardson, Taren N, NT wrote: Reason for CRM: Patient requesting a referral with a physical therapist, [facility] Will [facility]Brett Malloy [facility] for the sciatic nerve pain in his back. Phone#: [phone] Fax#: [phone]

## 2018-02-12 NOTE — Patient Instructions (Addendum)
I will check cholesterol readings today. No change in Lipitor dose for now.   Sciatica symptoms should continue to improve.  See information below on a few exercises you can try.   Slowly increase exercises as tolerated, and be cautious with forceful swings when returning to golf.   Return to the clinic or go to the nearest emergency room if any of your symptoms worsen or new symptoms occur.   Sciatica Rehab Ask your health care provider which exercises are safe for you. Do exercises exactly as told by your health care provider and adjust them as directed. It is normal to feel mild stretching, pulling, tightness, or discomfort as you do these exercises, but you should stop right away if you feel sudden pain or your pain gets worse.Do not begin these exercises until told by your health care provider. Stretching and range of motion exercises These exercises warm up your muscles and joints and improve the movement and flexibility of your hips and your back. These exercises also help to relieve pain, numbness, and tingling. Exercise A: Sciatic nerve glide 1. Sit in a chair with your head facing down toward your chest. Place your hands behind your back. Let your shoulders slump forward. 2. Slowly straighten one of your knees while you tilt your head back as if you are looking toward the ceiling. Only straighten your leg as far as you can without making your symptoms worse. 3. Hold for _________ seconds. 4. Slowly return to the starting position. 5. Repeat with your other leg. Repeat __________ times. Complete this exercise __________ times a day. Exercise B: Knee to chest with hip adduction and internal rotation  1. Lie on your back on a firm surface with both legs straight. 2. Bend one of your knees and move it up toward your chest until you feel a gentle stretch in your lower back and buttock. Then, move your knee toward the shoulder that is on the opposite side from your leg. ? Hold your leg in  this position by holding onto the front of your knee. 3. Hold for __________ seconds. 4. Slowly return to the starting position. 5. Repeat with your other leg. Repeat __________ times. Complete this exercise __________ times a day. Exercise C: Prone extension on elbows  1. Lie on your abdomen on a firm surface. A bed may be too soft for this exercise. 2. Prop yourself up on your elbows. 3. Use your arms to help lift your chest up until you feel a gentle stretch in your abdomen and your lower back. ? This will place some of your body weight on your elbows. If this is uncomfortable, try stacking pillows under your chest. ? Your hips should stay down, against the surface that you are lying on. Keep your hip and back muscles relaxed. 4. Hold for __________ seconds. 5. Slowly relax your upper body and return to the starting position. Repeat __________ times. Complete this exercise __________ times a day. Strengthening exercises These exercises build strength and endurance in your back. Endurance is the ability to use your muscles for a long time, even after they get tired. Exercise D: Pelvic tilt 1. Lie on your back on a firm surface. Bend your knees and keep your feet flat. 2. Tense your abdominal muscles. Tip your pelvis up toward the ceiling and flatten your lower back into the floor. ? To help with this exercise, you may place a small towel under your lower back and try to push your back into  the towel. 3. Hold for __________ seconds. 4. Let your muscles relax completely before you repeat this exercise. Repeat __________ times. Complete this exercise __________ times a day. Exercise E: Alternating arm and leg raises  1. Get on your hands and knees on a firm surface. If you are on a hard floor, you may want to use padding to cushion your knees, such as an exercise mat. 2. Line up your arms and legs. Your hands should be below your shoulders, and your knees should be below your hips. 3. Lift  your left leg behind you. At the same time, raise your right arm and straighten it in front of you. ? Do not lift your leg higher than your hip. ? Do not lift your arm higher than your shoulder. ? Keep your abdominal and back muscles tight. ? Keep your hips facing the ground. ? Do not arch your back. ? Keep your balance carefully, and do not hold your breath. 4. Hold for __________ seconds. 5. Slowly return to the starting position and repeat with your right leg and your left arm. Repeat __________ times. Complete this exercise __________ times a day. Posture and body mechanics  Body mechanics refers to the movements and positions of your body while you do your daily activities. Posture is part of body mechanics. Good posture and healthy body mechanics can help to relieve stress in your body's tissues and joints. Good posture means that your spine is in its natural S-curve position (your spine is neutral), your shoulders are pulled back slightly, and your head is not tipped forward. The following are general guidelines for applying improved posture and body mechanics to your everyday activities. Standing   When standing, keep your spine neutral and your feet about hip-width apart. Keep a slight bend in your knees. Your ears, shoulders, and hips should line up.  When you do a task in which you stand in one place for a long time, place one foot up on a stable object that is 2-4 inches (5-10 cm) high, such as a footstool. This helps keep your spine neutral. Sitting   When sitting, keep your spine neutral and keep your feet flat on the floor. Use a footrest, if necessary, and keep your thighs parallel to the floor. Avoid rounding your shoulders, and avoid tilting your head forward.  When working at a desk or a computer, keep your desk at a height where your hands are slightly lower than your elbows. Slide your chair under your desk so you are close enough to maintain good posture.  When working  at a computer, place your monitor at a height where you are looking straight ahead and you do not have to tilt your head forward or downward to look at the screen. Resting   When lying down and resting, avoid positions that are most painful for you.  If you have pain with activities such as sitting, bending, stooping, or squatting (flexion-based activities), lie in a position in which your body does not bend very much. For example, avoid curling up on your side with your arms and knees near your chest (fetal position).  If you have pain with activities such as standing for a long time or reaching with your arms (extension-based activities), lie with your spine in a neutral position and bend your knees slightly. Try the following positions: ? Lying on your side with a pillow between your knees. ? Lying on your back with a pillow under your knees. Lifting  When lifting objects, keep your feet at least shoulder-width apart and tighten your abdominal muscles.  Bend your knees and hips and keep your spine neutral. It is important to lift using the strength of your legs, not your back. Do not lock your knees straight out.  Always ask for help to lift heavy or awkward objects. This information is not intended to replace advice given to you by your health care provider. Make sure you discuss any questions you have with your health care provider. Document Released: 06/03/2005 Document Revised: 02/08/2016 Document Reviewed: 02/17/2015 Elsevier Interactive Patient Education  [employer].      If you have lab work done today you will be contacted with your lab results within the next 2 weeks.  If you have not heard from [place] then please contact us. The fastest way to get your results is to register for My Chart.   IF you received an x-ray today, you will receive an invoice from [facility]. Please contact [facility] at [phone] with questions or concerns regarding your  invoice.   IF you received labwork today, you will receive an invoice from [place]. Please contact [facility] at [phone] with questions or concerns regarding your invoice.   Our billing staff will not be able to assist you with questions regarding bills from these companies.  You will be contacted with the lab results as soon as they are available. The fastest way to get your results is to activate your My Chart account. Instructions are located on the last page of this paperwork. If you have not heard from [place] regarding the results in 2 weeks, please contact this office.

## 2018-02-13 NOTE — Telephone Encounter (Signed)
Patient calling to check the status of this referral. States that he would like to get this taken care of sooner, rather than later. Please advise.

## 2018-02-16 NOTE — Telephone Encounter (Signed)
Message re: pt requesting PT - sent to Dr. Neva Seat

## 2018-02-17 NOTE — Telephone Encounter (Signed)
Referral placed.

## 2018-02-20 DIAGNOSIS — M545 Low back pain: Secondary | ICD-10-CM | POA: Diagnosis not present

## 2018-02-20 DIAGNOSIS — R293 Abnormal posture: Secondary | ICD-10-CM | POA: Diagnosis not present

## 2018-02-20 DIAGNOSIS — M5432 Sciatica, left side: Secondary | ICD-10-CM | POA: Diagnosis not present

## 2018-02-20 DIAGNOSIS — M6281 Muscle weakness (generalized): Secondary | ICD-10-CM | POA: Diagnosis not present

## 2018-02-23 DIAGNOSIS — M5432 Sciatica, left side: Secondary | ICD-10-CM | POA: Diagnosis not present

## 2018-02-23 DIAGNOSIS — R293 Abnormal posture: Secondary | ICD-10-CM | POA: Diagnosis not present

## 2018-02-23 DIAGNOSIS — M6281 Muscle weakness (generalized): Secondary | ICD-10-CM | POA: Diagnosis not present

## 2018-02-23 DIAGNOSIS — M545 Low back pain: Secondary | ICD-10-CM | POA: Diagnosis not present

## 2018-03-03 DIAGNOSIS — M5432 Sciatica, left side: Secondary | ICD-10-CM | POA: Diagnosis not present

## 2018-03-03 DIAGNOSIS — M6281 Muscle weakness (generalized): Secondary | ICD-10-CM | POA: Diagnosis not present

## 2018-03-03 DIAGNOSIS — R293 Abnormal posture: Secondary | ICD-10-CM | POA: Diagnosis not present

## 2018-03-03 DIAGNOSIS — M545 Low back pain: Secondary | ICD-10-CM | POA: Diagnosis not present

## 2018-03-13 ENCOUNTER — Encounter: Payer: Self-pay | Admitting: Family Medicine

## 2018-06-29 ENCOUNTER — Other Ambulatory Visit: Payer: Self-pay | Admitting: Family Medicine

## 2018-06-29 DIAGNOSIS — E785 Hyperlipidemia, unspecified: Secondary | ICD-10-CM

## 2018-06-29 NOTE — Telephone Encounter (Signed)
Requested medication (s) are due for refill today: yes  Requested medication (s) are on the active medication list: yes  Last refill:  12/30/17 for 90 tabs and 1 refill  Future visit scheduled: no  Notes to clinic:  Antilipid - Statins failed.  Requested Prescriptions  Pending Prescriptions Disp Refills   atorvastatin (LIPITOR) 10 MG tablet [Pharmacy Med Name: ATORVASTATIN 10 MG TABLET] 90 tablet 1    Sig: TAKE 1 TABLET BY MOUTH EVERY DAY     Cardiovascular:  Antilipid - Statins Failed - 06/29/2018  1:44 AM      Failed - HDL in normal range and within 360 days    HDL  Date Value Ref Range Status  02/12/2018 30 (L) >39 mg/dL Final         Failed - Triglycerides in normal range and within 360 days    Triglycerides  Date Value Ref Range Status  02/12/2018 162 (H) 0 - 149 mg/dL Final         Passed - Total Cholesterol in normal range and within 360 days    Cholesterol, Total  Date Value Ref Range Status  02/12/2018 141 100 - 199 mg/dL Final         Passed - LDL in normal range and within 360 days    LDL Calculated  Date Value Ref Range Status  02/12/2018 79 0 - 99 mg/dL Final         Passed - Patient is not pregnant      Passed - Valid encounter within last 12 months    Recent Outpatient Visits          4 months ago Hyperlipidemia, unspecified hyperlipidemia type   [facility], Asencion Partridge, MD   5 months ago Lumbar pain   [facility] at [facility], Eilleen Kempf, MD   6 months ago Annual physical exam   [facility], Asencion Partridge, MD   1 year ago Essential hypertension   [facility], Asencion Partridge, MD   1 year ago Essential hypertension   [facility], Asencion Partridge, MD

## 2018-09-27 ENCOUNTER — Other Ambulatory Visit: Payer: Self-pay | Admitting: Family Medicine

## 2018-09-27 DIAGNOSIS — I10 Essential (primary) hypertension: Secondary | ICD-10-CM

## 2018-10-25 ENCOUNTER — Other Ambulatory Visit: Payer: Self-pay | Admitting: Family Medicine

## 2018-10-25 DIAGNOSIS — E785 Hyperlipidemia, unspecified: Secondary | ICD-10-CM

## 2018-11-23 ENCOUNTER — Other Ambulatory Visit: Payer: Self-pay | Admitting: Family Medicine

## 2018-11-23 DIAGNOSIS — E785 Hyperlipidemia, unspecified: Secondary | ICD-10-CM

## 2018-12-29 ENCOUNTER — Other Ambulatory Visit: Payer: Self-pay | Admitting: Family Medicine

## 2018-12-29 DIAGNOSIS — I10 Essential (primary) hypertension: Secondary | ICD-10-CM

## 2018-12-29 NOTE — Telephone Encounter (Signed)
Requested medication (s) are due for refill today: yes  Requested medication (s) are on the active medication list: yes  Last refill:  01/01/18  Future visit scheduled: no  Notes to clinic:  Pt overdue > 3 months for OV    Requested Prescriptions  Pending Prescriptions Disp Refills   lisinopril-hydrochlorothiazide (ZESTORETIC) 20-12.5 MG tablet [Pharmacy Med Name: LISINOPRIL-HCTZ 20-12.5 MG TAB] 180 tablet 2    Sig: TAKE 2 TABLETS BY MOUTH EVERY DAY     Cardiovascular:  ACEI + Diuretic Combos Failed - 12/29/2018  1:26 AM      Failed - Na in normal range and within 180 days    Sodium  Date Value Ref Range Status  02/12/2018 137 134 - 144 mmol/L Final         Failed - K in normal range and within 180 days    Potassium  Date Value Ref Range Status  02/12/2018 4.3 3.5 - 5.2 mmol/L Final         Failed - Cr in normal range and within 180 days    Creat  Date Value Ref Range Status  02/06/2015 0.87 0.70 - 1.33 mg/dL Final   Creatinine, Ser  Date Value Ref Range Status  02/12/2018 0.89 0.76 - 1.27 mg/dL Final         Failed - Ca in normal range and within 180 days    Calcium  Date Value Ref Range Status  02/12/2018 9.9 8.7 - 10.2 mg/dL Final         Failed - Valid encounter within last 6 months    Recent Outpatient Visits          10 months ago Hyperlipidemia, unspecified hyperlipidemia type   [facility], Ranell Patrick, MD   11 months ago Lumbar pain   [facility] at [facility], MD   12 months ago Annual physical exam   [facility], Ranell Patrick, MD   1 year ago Essential hypertension   [facility], Ranell Patrick, MD   2 years ago Essential hypertension   [facility], Ranell Patrick, MD             Passed - Patient is not pregnant      Passed - Last BP in normal range    BP Readings from Last 1 Encounters:  02/12/18 136/73

## 2019-01-26 ENCOUNTER — Other Ambulatory Visit: Payer: Self-pay | Admitting: *Deleted

## 2019-01-26 ENCOUNTER — Other Ambulatory Visit: Payer: Self-pay | Admitting: Family Medicine

## 2019-01-26 DIAGNOSIS — I10 Essential (primary) hypertension: Secondary | ICD-10-CM

## 2019-01-26 MED ORDER — LISINOPRIL-HYDROCHLOROTHIAZIDE 20-12.5 MG PO TABS: 2.0000 | ORAL_TABLET | Freq: Every day | ORAL | 0 refills | Status: DC

## 2019-01-26 NOTE — Telephone Encounter (Signed)
Requested Prescriptions  Pending Prescriptions Disp Refills  . lisinopril-hydrochlorothiazide (ZESTORETIC) 20-12.5 MG tablet 60 tablet 0    Sig: Take 2 tablets by mouth daily.     Cardiovascular:  ACEI + Diuretic Combos Failed - 01/26/2019  2:09 PM      Failed - Na in normal range and within 180 days    Sodium  Date Value Ref Range Status  02/12/2018 137 134 - 144 mmol/L Final         Failed - K in normal range and within 180 days    Potassium  Date Value Ref Range Status  02/12/2018 4.3 3.5 - 5.2 mmol/L Final         Failed - Cr in normal range and within 180 days    Creat  Date Value Ref Range Status  02/06/2015 0.87 0.70 - 1.33 mg/dL Final   Creatinine, Ser  Date Value Ref Range Status  02/12/2018 0.89 0.76 - 1.27 mg/dL Final         Failed - Ca in normal range and within 180 days    Calcium  Date Value Ref Range Status  02/12/2018 9.9 8.7 - 10.2 mg/dL Final         Failed - Valid encounter within last 6 months    Recent Outpatient Visits          11 months ago Hyperlipidemia, unspecified hyperlipidemia type   [facility], Ranell Patrick, MD   12 months ago Lumbar pain   [facility] at [facility], MD   1 year ago Annual physical exam   [facility], Ranell Patrick, MD   1 year ago Essential hypertension   [facility], Ranell Patrick, MD   2 years ago Essential hypertension   [facility] at [facility], MD             Passed - Patient is not pregnant      Passed - Last BP in normal range    BP Readings from Last 1 Encounters:  02/12/18 136/73

## 2019-01-26 NOTE — Telephone Encounter (Signed)
Requested Prescriptions  Pending Prescriptions Disp Refills  . amLODipine (NORVASC) 5 MG tablet [Pharmacy Med Name: AMLODIPINE BESYLATE 5 MG TAB] 30 tablet 0    Sig: TAKE 1 TABLET BY MOUTH EVERY DAY     Cardiovascular:  Calcium Channel Blockers Failed - 01/26/2019  1:52 PM      Failed - Valid encounter within last 6 months    Recent Outpatient Visits          11 months ago Hyperlipidemia, unspecified hyperlipidemia type   [facility], Ranell Patrick, MD   12 months ago Lumbar pain   [facility] at [facility], Ines Bloomer, MD   1 year ago Annual physical exam   [facility] at [facility], Ranell Patrick, MD   1 year ago Essential hypertension   [facility], Ranell Patrick, MD   2 years ago Essential hypertension   [facility] at [facility], MD             Passed - Last BP in normal range    BP Readings from Last 1 Encounters:  02/12/18 136/73         Spoke with patient- advised him he is due for follow up appointment.  He states he will call to schedule as soon as possible.  30 day courtesy refill sent.

## 2019-01-26 NOTE — Telephone Encounter (Signed)
Spoke with patient regarding a refill request received by [facility] refill pool for amlodipine.  Patient states that he also needs lisinopril/hctz 20/12.2 mg refilled.  Advised patient he is due for follow up with Dr. Carlota Raspberry.  He states he will call to schedule the appointment as soon as possible.

## 2019-02-21 ENCOUNTER — Other Ambulatory Visit: Payer: Self-pay | Admitting: Family Medicine

## 2019-02-21 DIAGNOSIS — E785 Hyperlipidemia, unspecified: Secondary | ICD-10-CM

## 2019-02-22 ENCOUNTER — Other Ambulatory Visit: Payer: Self-pay | Admitting: Family Medicine

## 2019-02-22 DIAGNOSIS — I10 Essential (primary) hypertension: Secondary | ICD-10-CM

## 2019-02-23 NOTE — Telephone Encounter (Signed)
Requested medication (s) are due for refill today: yes  Requested medication (s) are on the active medication list: yes  Last refill:  11/23/2018  Future visit scheduled: yes  Notes to clinic: review for refill  Requested Prescriptions  Pending Prescriptions Disp Refills   atorvastatin (LIPITOR) 10 MG tablet [Pharmacy Med Name: ATORVASTATIN 10 MG TABLET] 90 tablet 0    Sig: TAKE 1 TABLET BY MOUTH EVERY DAY     Cardiovascular:  Antilipid - Statins Failed - 02/21/2019 10:30 AM      Failed - Total Cholesterol in normal range and within 360 days    Cholesterol, Total  Date Value Ref Range Status  02/12/2018 141 100 - 199 mg/dL Final         Failed - LDL in normal range and within 360 days    LDL Calculated  Date Value Ref Range Status  02/12/2018 79 0 - 99 mg/dL Final         Failed - HDL in normal range and within 360 days    HDL  Date Value Ref Range Status  02/12/2018 30 (L) >39 mg/dL Final         Failed - Triglycerides in normal range and within 360 days    Triglycerides  Date Value Ref Range Status  02/12/2018 162 (H) 0 - 149 mg/dL Final         Failed - Valid encounter within last 12 months    Recent Outpatient Visits          1 year ago Hyperlipidemia, unspecified hyperlipidemia type   [facility], Ranell Patrick, MD   1 year ago Lumbar pain   [facility] at [facility], MD   1 year ago Annual physical exam   [facility], Ranell Patrick, MD   1 year ago Essential hypertension   [facility] at [facility], Ranell Patrick, MD   2 years ago Essential hypertension   [facility] at [facility], Ranell Patrick, MD             Passed - Patient is not pregnant

## 2019-03-12 ENCOUNTER — Telehealth: Payer: Self-pay | Admitting: Family Medicine

## 2019-03-12 NOTE — Telephone Encounter (Signed)
Pt has appt on 10/5. Wants to come in at 8 am for fasting labs. Orders not in

## 2019-03-22 ENCOUNTER — Ambulatory Visit (INDEPENDENT_AMBULATORY_CARE_PROVIDER_SITE_OTHER): Payer: BC Managed Care – PPO | Admitting: Family Medicine

## 2019-03-22 ENCOUNTER — Other Ambulatory Visit: Payer: Self-pay

## 2019-03-22 ENCOUNTER — Encounter: Payer: Self-pay | Admitting: Family Medicine

## 2019-03-22 VITALS — BP 150/72 | HR 77 | Temp 97.8°F | Resp 14 | Wt 223.4 lb

## 2019-03-22 DIAGNOSIS — E785 Hyperlipidemia, unspecified: Secondary | ICD-10-CM | POA: Diagnosis not present

## 2019-03-22 DIAGNOSIS — R739 Hyperglycemia, unspecified: Secondary | ICD-10-CM

## 2019-03-22 DIAGNOSIS — I10 Essential (primary) hypertension: Secondary | ICD-10-CM | POA: Diagnosis not present

## 2019-03-22 DIAGNOSIS — R7303 Prediabetes: Secondary | ICD-10-CM | POA: Diagnosis not present

## 2019-03-22 LAB — COMPREHENSIVE METABOLIC PANEL
ALT: 14 IU/L (ref 0–44)
AST: 17 IU/L (ref 0–40)
Albumin/Globulin Ratio: 1.7 (ref 1.2–2.2)
Albumin: 4.5 g/dL (ref 3.8–4.9)
Alkaline Phosphatase: 84 IU/L (ref 39–117)
BUN/Creatinine Ratio: 11 (ref 9–20)
BUN: 11 mg/dL (ref 6–24)
Bilirubin Total: 0.3 mg/dL (ref 0.0–1.2)
CO2: 22 mmol/L (ref 20–29)
Calcium: 9.3 mg/dL (ref 8.7–10.2)
Chloride: 103 mmol/L (ref 96–106)
Creatinine, Ser: 0.96 mg/dL (ref 0.76–1.27)
GFR calc Af Amer: 102 mL/min/{1.73_m2} (ref >59)
GFR calc non Af Amer: 88 mL/min/{1.73_m2} (ref >59)
Globulin, Total: 2.7 g/dL (ref 1.5–4.5)
Glucose: 103 mg/dL — ABNORMAL HIGH (ref 65–99)
Potassium: 4.3 mmol/L (ref 3.5–5.2)
Sodium: 137 mmol/L (ref 134–144)
Total Protein: 7.2 g/dL (ref 6.0–8.5)

## 2019-03-22 LAB — HEMOGLOBIN A1C
Est. average glucose Bld gHb Est-mCnc: 114 mg/dL
Hgb A1c MFr Bld: 5.6 % (ref 4.8–5.6)

## 2019-03-22 LAB — LIPID PANEL
Chol/HDL Ratio: 4.4 ratio (ref 0.0–5.0)
Cholesterol, Total: 141 mg/dL (ref 100–199)
HDL: 32 mg/dL — ABNORMAL LOW (ref >39)
LDL Chol Calc (NIH): 88 mg/dL (ref 0–99)
Triglycerides: 111 mg/dL (ref 0–149)
VLDL Cholesterol Cal: 21 mg/dL (ref 5–40)

## 2019-03-22 MED ORDER — LISINOPRIL-HYDROCHLOROTHIAZIDE 20-12.5 MG PO TABS: 2.0000 | ORAL_TABLET | Freq: Every day | ORAL | 2 refills | Status: DC

## 2019-03-22 MED ORDER — AMLODIPINE BESYLATE 5 MG PO TABS: 5.0000 mg | ORAL_TABLET | Freq: Every day | ORAL | 2 refills | Status: DC

## 2019-03-22 MED ORDER — ATORVASTATIN CALCIUM 10 MG PO TABS: 10.0000 mg | ORAL_TABLET | Freq: Every day | ORAL | 2 refills | Status: DC

## 2019-03-22 NOTE — Patient Instructions (Addendum)
  Blood pressure too high today. Keep a record of your blood pressures outside of the office and bring them to me in next 2 weeks to decide on med changes. No change for now in meds.   Let me know when you are ready to quit smoking. That is one of the most important changes for your health at this time.   Low intensity exercise with walking most days per week for exercise.    If you have lab work done today you will be contacted with your lab results within the next 2 weeks.  If you have not heard from [place] then please contact us. The fastest way to get your results is to register for My Chart.   IF you received an x-ray today, you will receive an invoice from [facility]. Please contact [facility] at [phone] with questions or concerns regarding your invoice.   IF you received labwork today, you will receive an invoice from [place]. Please contact [facility] at [phone] with questions or concerns regarding your invoice.   Our billing staff will not be able to assist you with questions regarding bills from these companies.  You will be contacted with the lab results as soon as they are available. The fastest way to get your results is to activate your My Chart account. Instructions are located on the last page of this paperwork. If you have not heard from [place] regarding the results in 2 weeks, please contact this office.

## 2019-03-22 NOTE — Progress Notes (Signed)
Subjective:    Patient ID: Jonathan Bush, male    DOB: 1960-06-28, 58 y.o.   MRN: [id number]  HPI Jonathan Bush is a 58 y.o. male Presents today for: Chief Complaint  Patient presents with  . chronic medical condition    Patient here for f/u on chronic medical refills on medication    Hypertension: BP Readings from Last 3 Encounters:  03/22/19 (!) 150/72  02/12/18 136/73  01/28/18 130/72   Lab Results  Component Value Date   CREATININE 0.89 02/12/2018  Last evaluated in August 2019.  Blood pressure borderline but controlled without home on lisinopril HCTZ 20/12.5 mg - 2 per day, amlodipine 5 mg daily. Took meds today, but missed doses yesterday. Rare missed dose.  No recent home readings, but had been in 130/60's.   Hyperlipidemia:  Lab Results  Component Value Date   CHOL 141 02/12/2018   HDL 30 (L) 02/12/2018   LDLCALC 79 02/12/2018   TRIG 162 (H) 02/12/2018   CHOLHDL 4.7 02/12/2018   Lab Results  Component Value Date   ALT 15 02/12/2018   AST 21 02/12/2018   ALKPHOS 72 02/12/2018   BILITOT 0.3 02/12/2018  Lipitor 10 mg daily. No new side effects. Taking daily.   Prediabetes: Lab Results  Component Value Date   HGBA1C 5.6 12/30/2017   Wt Readings from Last 3 Encounters:  03/22/19 223 lb 6.4 oz (101.3 kg)  02/12/18 223 lb (101.2 kg)  01/28/18 221 lb (100.2 kg)  Body mass index is 30.3 kg/m.  Driving with Golf. Planning on starting up walking.   Tobacco abuse: 1 pack per day. Not ready to quit.    Patient Active Problem List   Diagnosis Date Noted  . Lumbar pain 01/28/2018  . Sciatica of left side 01/28/2018  . Musculoskeletal pain 01/28/2018  . Muscle spasm 01/28/2018  . Essential hypertension 05/02/2017  . Hyperlipidemia 05/02/2017  . Prediabetes 05/02/2017   Past Medical History:  Diagnosis Date  . Hypertension    Past Surgical History:  Procedure Laterality Date  . VASECTOMY     No Known Allergies Prior to Admission medications    Medication Sig Start Date End Date Taking? Authorizing Provider  amLODipine (NORVASC) 5 MG tablet TAKE 1 TABLET BY MOUTH EVERY DAY 01/26/19  Yes Jonathan Bush, Jonathan Fiumara Bush, Jonathan Bush  atorvastatin (LIPITOR) 10 MG tablet TAKE 1 TABLET BY MOUTH EVERY DAY 02/25/19  Yes Jonathan Bush, Jonathan Zabriskie Bush, Jonathan Bush  fish oil-omega-3 fatty acids 1000 MG capsule Take 2 g by mouth daily.   Yes Provider, Historical, Jonathan Bush  glucosamine-chondroitin 500-400 MG tablet Take 1 tablet by mouth 3 (three) times daily.   Yes Provider, Historical, Jonathan Bush  lisinopril-hydrochlorothiazide (ZESTORETIC) 20-12.5 MG tablet Take 2 tablets by mouth daily. 01/26/19  Yes Jonathan Bush, Saja Bartolini Bush, Jonathan Bush  Multiple Vitamin (MULTIVITAMIN) tablet Take 1 tablet by mouth daily.   Yes Provider, Historical, Jonathan Bush   Social History   Socioeconomic History  . Marital status: Unknown    Spouse name: Not on file  . Number of children: Not on file  . Years of education: Not on file  . Highest education level: Not on file  Occupational History  . Occupation: Investment banker, corporateales    Employer: [employer]  Social Needs  . Financial resource strain: Not on file  . Food insecurity    Worry: Not on file    Inability: Not on file  . Transportation needs    Medical: Not on file    Non-medical: Not on file  Tobacco Use  . Smoking status: Current Every Day Smoker    Packs/day: 1.00    Years: 30.00    Pack years: 30.00    Types: Cigarettes  . Smokeless tobacco: Never Used  Substance and Sexual Activity  . Alcohol use: Yes    Comment: beer  . Drug use: No  . Sexual activity: Yes    Partners: Female  Lifestyle  . Physical activity    Days per week: Not on file    Minutes per session: Not on file  . Stress: Not on file  Relationships  . Social Musician on phone: Not on file    Gets together: Not on file    Attends religious service: Not on file    Active member of club or organization: Not on file    Attends meetings of clubs or organizations: Not on file    Relationship status: Not on  file  . Intimate partner violence    Fear of current or ex partner: Not on file    Emotionally abused: Not on file    Physically abused: Not on file    Forced sexual activity: Not on file  Other Topics Concern  . Not on file  Social History Narrative   Patient walks for exercise.    Review of Systems  Constitutional: Negative for fatigue and unexpected weight change.  Eyes: Negative for visual disturbance.  Respiratory: Negative for cough, chest tightness and shortness of breath.   Cardiovascular: Negative for chest pain, palpitations and leg swelling.  Gastrointestinal: Negative for abdominal pain and blood in stool.  Neurological: Negative for dizziness, light-headedness and headaches.       Objective:   Physical Exam Vitals signs reviewed.  Constitutional:      Appearance: He is well-developed.  HENT:     Head: Normocephalic and atraumatic.  Eyes:     Pupils: Pupils are equal, round, and reactive to light.  Neck:     Vascular: No carotid bruit or JVD.  Cardiovascular:     Rate and Rhythm: Normal rate and regular rhythm.     Heart sounds: Normal heart sounds. No murmur.  Pulmonary:     Effort: Pulmonary effort is normal.     Breath sounds: Normal breath sounds. No rales.  Skin:    General: Skin is warm and dry.  Neurological:     Mental Status: He is alert and oriented to person, place, and time.    Vitals:   03/22/19 1048 03/22/19 1055  BP: (!) 155/76 (!) 150/72  Pulse: 77   Resp: 14   Temp: 97.8 F (36.6 C)   TempSrc: Oral   SpO2: 97%   Weight: 223 lb 6.4 oz (101.3 kg)        Assessment & Plan:  Jonathan Bush is a 58 y.o. male Hyperglycemia - Plan: Hemoglobin A1c History of Prediabetes - Plan: Hemoglobin A1c  -Check A1c, importance of exercise, weight management discussed.  Hyperlipidemia, unspecified hyperlipidemia type - Plan: Comprehensive metabolic panel, Lipid panel  -Tolerating Lipitor, continue same.  Essential hypertension - Plan:  Comprehensive metabolic panel  -Decreased control, missed dosing of medicine yesterday.  Monitor home readings, bring copy of those over the next 2 weeks to consider adjustment in meds.  Recheck with me in 3 months for physical.   No orders of the defined types were placed in this encounter.  Patient Instructions    Blood pressure too high today. Keep a record of your blood  pressures outside of the office and bring them to me in next 2 weeks to decide on med changes. No change for now in meds.   Let me know when you are ready to quit smoking. That is one of the most important changes for your health at this time.   Low intensity exercise with walking most days per week for exercise.    If you have lab work done today you will be contacted with your lab results within the next 2 weeks.  If you have not heard from [place] then please contact us. The fastest way to get your results is to register for My Chart.   IF you received an x-ray today, you will receive an invoice from [facility]. Please contact [facility] at [phone] with questions or concerns regarding your invoice.   IF you received labwork today, you will receive an invoice from [facility]. Please contact [facility] at [phone] with questions or concerns regarding your invoice.   Our billing staff will not be able to assist you with questions regarding bills from these companies.  You will be contacted with the lab results as soon as they are available. The fastest way to get your results is to activate your My Chart account. Instructions are located on the last page of this paperwork. If you have not heard from [place] regarding the results in 2 weeks, please contact this office.       Signed,   Merri Ray, [facility] [place].  03/22/19 12:50 PM

## 2019-03-30 ENCOUNTER — Encounter: Payer: Self-pay | Admitting: Radiology

## 2019-06-24 ENCOUNTER — Encounter: Payer: BC Managed Care – PPO | Admitting: Family Medicine

## 2019-08-31 ENCOUNTER — Ambulatory Visit: Payer: Self-pay | Attending: Internal Medicine

## 2019-08-31 ENCOUNTER — Ambulatory Visit: Payer: Self-pay

## 2019-08-31 ENCOUNTER — Other Ambulatory Visit: Payer: Self-pay

## 2019-08-31 DIAGNOSIS — Z23 Encounter for immunization: Secondary | ICD-10-CM

## 2019-08-31 NOTE — Progress Notes (Signed)
   Covid-19 Vaccination Clinic  Name:  Jonathan Bush    MRN: [id number] DOB: 1961-06-11  08/31/2019  Jonathan Bush was observed post Covid-19 immunization for 15 minutes without incident. He was provided with Vaccine Information Sheet and instruction to access the V-Safe system.   Jonathan Bush was instructed to call 911 with any severe reactions post vaccine: Marland Kitchen Difficulty breathing  . Swelling of face and throat  . A fast heartbeat  . A bad rash all over body  . Dizziness and weakness   Immunizations Administered    Name Date Dose VIS Date Route   Moderna COVID-19 Vaccine 08/31/2019  2:44 PM 0.5 mL 05/18/2019 Intramuscular   Manufacturer: Moderna   Lot: [id number]   NDC: 81388-719-59

## 2019-09-22 ENCOUNTER — Encounter: Payer: Self-pay | Admitting: Family Medicine

## 2019-09-22 ENCOUNTER — Ambulatory Visit (INDEPENDENT_AMBULATORY_CARE_PROVIDER_SITE_OTHER): Payer: BC Managed Care – PPO | Admitting: Family Medicine

## 2019-09-22 ENCOUNTER — Other Ambulatory Visit: Payer: Self-pay

## 2019-09-22 VITALS — BP 142/82 | HR 72 | Temp 98.6°F | Ht 72.0 in | Wt 226.0 lb

## 2019-09-22 DIAGNOSIS — Z0000 Encounter for general adult medical examination without abnormal findings: Secondary | ICD-10-CM

## 2019-09-22 DIAGNOSIS — R7303 Prediabetes: Secondary | ICD-10-CM | POA: Diagnosis not present

## 2019-09-22 DIAGNOSIS — I10 Essential (primary) hypertension: Secondary | ICD-10-CM

## 2019-09-22 DIAGNOSIS — Z0001 Encounter for general adult medical examination with abnormal findings: Secondary | ICD-10-CM | POA: Diagnosis not present

## 2019-09-22 DIAGNOSIS — F17210 Nicotine dependence, cigarettes, uncomplicated: Secondary | ICD-10-CM

## 2019-09-22 DIAGNOSIS — E785 Hyperlipidemia, unspecified: Secondary | ICD-10-CM

## 2019-09-22 DIAGNOSIS — Z1211 Encounter for screening for malignant neoplasm of colon: Secondary | ICD-10-CM

## 2019-09-22 DIAGNOSIS — Z6830 Body mass index (BMI) 30.0-30.9, adult: Secondary | ICD-10-CM

## 2019-09-22 MED ORDER — LISINOPRIL-HYDROCHLOROTHIAZIDE 20-12.5 MG PO TABS: 2.0000 | ORAL_TABLET | Freq: Every day | ORAL | 2 refills | Status: DC

## 2019-09-22 MED ORDER — ATORVASTATIN CALCIUM 10 MG PO TABS: 10.0000 mg | ORAL_TABLET | Freq: Every day | ORAL | 2 refills | Status: DC

## 2019-09-22 MED ORDER — AMLODIPINE BESYLATE 10 MG PO TABS: 5.0000 mg | ORAL_TABLET | Freq: Every day | ORAL | 2 refills | Status: DC

## 2019-09-22 NOTE — Patient Instructions (Addendum)
It is very important for your health that you quit smoking. Let me know when you are ready.   Increase in low intensity activity - walking, and cut back on sugar containing beverages, alcohol for weight loss.   Increase amlodipine to 10mg  for better blood pressure control. If leg swelling, or new side effects - let me know.   Recheck in 6 months.   Keeping you healthy  Get these tests  Blood pressure- Have your blood pressure checked once a year by your healthcare provider.  Normal blood pressure is 120/80  Weight- Have your body mass index (BMI) calculated to screen for obesity.  BMI is a measure of body fat based on height and weight. You can also calculate your own BMI at .  Cholesterol- Have your cholesterol checked every year.  Diabetes- Have your blood sugar checked regularly if you have high blood pressure, high cholesterol, have a family history of diabetes or if you are overweight.  Screening for Colon Cancer- Colonoscopy starting at age 17.  Screening may begin sooner depending on your family history and other health conditions. Follow up colonoscopy as directed by your Gastroenterologist.  Screening for Prostate Cancer- Both blood work (PSA) and a rectal exam help screen for Prostate Cancer.  Screening begins at age 42 with African-American men and at age 49 with Caucasian men.  Screening may begin sooner depending on your family history.  Take these medicines  Aspirin- One aspirin daily can help prevent Heart disease and Stroke.  Flu shot- Every fall.  Tetanus- Every 10 years.  Zostavax- Once after the age of 76 to prevent Shingles.  Pneumonia shot- Once after the age of 56; if you are younger than 68, ask your healthcare provider if you need a Pneumonia shot.  Take these steps  Don't smoke- If you do smoke, talk to your doctor about quitting.  For tips on how to quit, go to www.smokefree.gov or call 1-800-QUIT-NOW.  Be physically active-  Exercise 5 days a week for at least 30 minutes.  If you are not already physically active start slow and gradually work up to 30 minutes of moderate physical activity.  Examples of moderate activity include walking briskly, mowing the yard, dancing, swimming, bicycling, etc.  Eat a healthy diet- Eat a variety of healthy food such as fruits, vegetables, low fat milk, low fat cheese, yogurt, lean meant, poultry, fish, beans, tofu, etc. For more information go to www.thenutritionsource.org  Drink alcohol in moderation- Limit alcohol intake to less than two drinks a day. Never drink and drive.  Dentist- Brush and floss twice daily; visit your dentist twice a year.  Depression- Your emotional health is as important as your physical health. If you're feeling down, or losing interest in things you would normally enjoy please talk to your healthcare provider.  Eye exam- Visit your eye doctor every year.  Safe sex- If you may be exposed to a sexually transmitted infection, use a condom.  Seat belts- Seat belts can save your life; always wear one.  Smoke/Carbon Monoxide detectors- These detectors need to be installed on the appropriate level of your home.  Replace batteries at least once a year.  Skin cancer- When out in the sun, cover up and use sunscreen 15 SPF or higher.  Violence- If anyone is threatening you, please tell your healthcare provider.  Living Will/ Health care power of attorney- Speak with your healthcare provider and family.   If you have lab work done today you will  be contacted with your lab results within the next 2 weeks.  If you have not heard from [place] then please contact us. The fastest way to get your results is to register for My Chart.   IF you received an x-ray today, you will receive an invoice from [facility]. Please contact [facility] at [phone] with questions or concerns regarding your invoice.   IF you received labwork today, you will  receive an invoice from [facility]. Please contact [facility] at [phone] with questions or concerns regarding your invoice.   Our billing staff will not be able to assist you with questions regarding bills from these companies.  You will be contacted with the lab results as soon as they are available. The fastest way to get your results is to activate your My Chart account. Instructions are located on the last page of this paperwork. If you have not heard from [place] regarding the results in 2 weeks, please contact this office.

## 2019-09-22 NOTE — Progress Notes (Signed)
pt states he feels good with no complaints at this time. Pt doesn't check his BP. Pt dose states he hasn't had any issues with his BP and is taking medication as prescribed. No physical symptoms of hypertension.

## 2019-09-22 NOTE — Progress Notes (Signed)
Subjective:  Patient ID: Jonathan Bush, male    DOB: 06-30-1960  Age: 59 y.o. MRN: [id number]  CC:  Chief Complaint  Patient presents with  . Annual Exam    pt states he feels good with no complaints at this time.    HPI [name] presents for   Annual physical exam.   Hypertension: Amlodipine 5 mg daily, lisinopril HCTZ 20/12.5 mg daily.  Meds discussed in October with plan for closer monitoring back on medication.  Had missed dose of medication day prior. Did not return with readings, not measuring home readings.  Home readings: none.  No CP with exertion.  BP Readings from Last 3 Encounters:  09/22/19 (!) 142/82  03/22/19 (!) 150/72  02/12/18 136/73   Lab Results  Component Value Date   CREATININE 0.96 03/22/2019   Hyperlipidemia: Lipitor 10 mg daily.  Lab Results  Component Value Date   CHOL 141 03/22/2019   HDL 32 (L) 03/22/2019   LDLCALC 88 03/22/2019   TRIG 111 03/22/2019   CHOLHDL 4.4 03/22/2019   Lab Results  Component Value Date   ALT 14 03/22/2019   AST 17 03/22/2019   ALKPHOS 84 03/22/2019   BILITOT 0.3 03/22/2019    Hyperglycemia/history of prediabetes. A1c 5.6 in July 2019 in October of last year.  Had been up to 5.9 in 2018. Body mass index is 30.65 kg/m. Wt Readings from Last 3 Encounters:  09/22/19 226 lb (102.5 kg)  03/22/19 223 lb 6.4 oz (101.3 kg)  02/12/18 223 lb (101.2 kg)  no new exercise. Golfing 1 day per week. Increased golf in May with tournaments.  Sweet tea daily. Rare fast food.  Alcohol - 12 beers per week. When playing golf.   Nicotine addiction: Cessation discussed multiple times in past, not ready to quit.  About 1 pack per day.  Not ready to quit yet.   Cancer screening: Overdue for colonoscopy: has not had. Agrees to cologuard - declined colonoscopy at present.  Prostate:after r/b of testing - declines testing today.  Lab Results  Component Value Date   PSA1 0.5 12/30/2017   Immunization History    Administered Date(s) Administered  . Moderna SARS-COVID-2 Vaccination 08/31/2019  . Tdap 12/30/2017  Status post first Covid vaccine March 16, 2nd one next week.   Depression screen [facility] 2/9 09/22/2019 03/22/2019 02/12/2018 01/28/2018 12/30/2017  Decreased Interest 0 0 0 0 0  Down, Depressed, Hopeless 0 0 0 0 0  PHQ - 2 Score 0 0 0 0 0    Hearing Screening   125Hz  250Hz  500Hz  1000Hz  2000Hz  3000Hz  4000Hz  6000Hz  8000Hz   Right ear:           Left ear:             Visual Acuity Screening   Right eye Left eye Both eyes  Without correction: 20/25 20/30 20/25   With correction:     last optho - 2 years ago. He will schedule appt.   Dental: every 6 months.   Exercise:as above.     History Patient Active Problem List   Diagnosis Date Noted  . Lumbar pain 01/28/2018  . Sciatica of left side 01/28/2018  . Musculoskeletal pain 01/28/2018  . Muscle spasm 01/28/2018  . Essential hypertension 05/02/2017  . Hyperlipidemia 05/02/2017  . Prediabetes 05/02/2017   Past Medical History:  Diagnosis Date  . Hypertension    Past Surgical History:  Procedure Laterality Date  . VASECTOMY     No Known  Allergies Prior to Admission medications   Medication Sig Start Date End Date Taking? Authorizing Provider  amLODipine (NORVASC) 5 MG tablet Take 1 tablet (5 mg total) by mouth daily. 03/22/19  Yes Shade Flood, MD  atorvastatin (LIPITOR) 10 MG tablet Take 1 tablet (10 mg total) by mouth daily. 03/22/19  Yes Shade Flood, MD  fish oil-omega-3 fatty acids 1000 MG capsule Take 2 g by mouth daily.   Yes [provider]  glucosamine-chondroitin 500-400 MG tablet Take 1 tablet by mouth 3 (three) times daily.   Yes [provider]  lisinopril-hydrochlorothiazide (ZESTORETIC) 20-12.5 MG tablet Take 2 tablets by mouth daily. 03/22/19  Yes Shade Flood, MD  Multiple Vitamin (MULTIVITAMIN) tablet Take 1 tablet by mouth daily.   Yes [provider]   Social History    Socioeconomic History  . Marital status: Unknown    Spouse name: Not on file  . Number of children: Not on file  . Years of education: Not on file  . Highest education level: Not on file  Occupational History  . Occupation: Investment banker, corporate: [employer]  Tobacco Use  . Smoking status: Current Every Day Smoker    Packs/day: 1.00    Years: 30.00    Pack years: 30.00    Types: Cigarettes  . Smokeless tobacco: Never Used  Substance and Sexual Activity  . Alcohol use: Yes    Alcohol/week: 12.0 standard drinks    Types: 12 Cans of beer per week    Comment: beer  . Drug use: No  . Sexual activity: Yes    Partners: Female  Other Topics Concern  . Not on file  Social History Narrative   Patient walks for exercise.   Social Determinants of Health   Financial Resource Strain:   . Difficulty of Paying Living Expenses:   Food Insecurity:   . Worried About Programme researcher, broadcasting/film/video in the Last Year:   . Barista in the Last Year:   Transportation Needs:   . Freight forwarder (Medical):   Marland Kitchen Lack of Transportation (Non-Medical):   Physical Activity:   . Days of Exercise per Week:   . Minutes of Exercise per Session:   Stress:   . Feeling of Stress :   Social Connections:   . Frequency of Communication with Friends and Family:   . Frequency of Social Gatherings with Friends and Family:   . Attends Religious Services:   . Active Member of Clubs or Organizations:   . Attends Banker Meetings:   Marland Kitchen Marital Status:   Intimate Partner Violence:   . Fear of Current or Ex-Partner:   . Emotionally Abused:   Marland Kitchen Physically Abused:   . Sexually Abused:     Review of Systems 13 point review of systems per patient health survey noted.  Negative other than as indicated above or in HPI.    Objective:   Vitals:   09/22/19 1324 09/22/19 1333  BP: (!) 165/85 (!) 142/82  Pulse: 72   Temp: 98.6 F (37 C)   TempSrc: Temporal   SpO2: 96%   Weight: 226 lb (102.5  kg)   Height: 6' (1.829 m)      Physical Exam Vitals reviewed.  Constitutional:      Appearance: He is well-developed.  HENT:     Head: Normocephalic and atraumatic.     Right Ear: External ear normal.     Left Ear: External ear  normal.  Eyes:     Conjunctiva/sclera: Conjunctivae normal.     Pupils: Pupils are equal, round, and reactive to light.  Neck:     Thyroid: No thyromegaly.     Vascular: No carotid bruit or JVD.  Cardiovascular:     Rate and Rhythm: Normal rate and regular rhythm.     Heart sounds: Normal heart sounds. No murmur.  Pulmonary:     Effort: Pulmonary effort is normal. No respiratory distress.     Breath sounds: Normal breath sounds. No wheezing or rales.  Abdominal:     General: There is no distension.     Palpations: Abdomen is soft.     Tenderness: There is no abdominal tenderness.  Musculoskeletal:        General: No tenderness. Normal range of motion.     Cervical back: Normal range of motion and neck supple.  Lymphadenopathy:     Cervical: No cervical adenopathy.  Skin:    General: Skin is warm and dry.  Neurological:     Mental Status: He is alert and oriented to person, place, and time.     Deep Tendon Reflexes: Reflexes are normal and symmetric.  Psychiatric:        Behavior: Behavior normal.     Assessment & Plan:  DANTE ROUDEBUSH is a 59 y.o. male . Annual physical exam  - -anticipatory guidance as below in AVS, screening labs above. Health maintenance items as above in HPI discussed/recommended as applicable.   Hyperlipidemia, unspecified hyperlipidemia type - Plan: Lipid panel, Comprehensive metabolic panel, atorvastatin (LIPITOR) 10 MG tablet  -  Stable, tolerating current regimen. Medications refilled. Labs pending as above.   Essential hypertension - Plan: Lipid panel, Comprehensive metabolic panel, lisinopril-hydrochlorothiazide (ZESTORETIC) 20-12.5 MG tablet, amLODipine (NORVASC) 10 MG tablet  -Decreased control, will increase  amlodipine to 10 mg.  Held on increased diuretic given his work outside, golfing outside.  Potential side effects of amlodipine discussed with RTC precautions.  Prediabetes - Plan: Hemoglobin A1c  -Weight loss with diet changes, low intensity activity discussed.  Check labs  Special screening for malignant neoplasms, colon - Plan: Cologuard  -Colon cancer screening options discussed including colonoscopy versus Cologuard, repeat testing interval and potential need for diagnostic colonoscopy if positive Cologuard.  Understand expressed and chose Cologuard  Cigarette nicotine dependence without complication  -Importance of tobacco cessation discussed including discussion of his other cardiac risk factors include hypertension, hyperlipidemia, prediabetes.  Not ready for cessation at this time.  BMI 30.0-30.9,adult  -Diet/exercise discussed as above.  Meds ordered this encounter  Medications  . atorvastatin (LIPITOR) 10 MG tablet    Sig: Take 1 tablet (10 mg total) by mouth daily.    Dispense:  90 tablet    Refill:  2  . lisinopril-hydrochlorothiazide (ZESTORETIC) 20-12.5 MG tablet    Sig: Take 2 tablets by mouth daily.    Dispense:  180 tablet    Refill:  2  . amLODipine (NORVASC) 10 MG tablet    Sig: Take 0.5 tablets (5 mg total) by mouth daily.    Dispense:  90 tablet    Refill:  2    DX Code Needed  .   Patient Instructions    It is very important for your health that you quit smoking. Let me know when you are ready.   Increase in low intensity activity - walking, and cut back on sugar containing beverages, alcohol for weight loss.   Increase amlodipine to 10mg  for better  blood pressure control. If leg swelling, or new side effects - let me know.   Recheck in 6 months.   Keeping you healthy  Get these tests  Blood pressure- Have your blood pressure checked once a year by your healthcare provider.  Normal blood pressure is 120/80  Weight- Have your body mass index (BMI)  calculated to screen for obesity.  BMI is a measure of body fat based on height and weight. You can also calculate your own BMI at [email or web address].  Cholesterol- Have your cholesterol checked every year.  Diabetes- Have your blood sugar checked regularly if you have high blood pressure, high cholesterol, have a family history of diabetes or if you are overweight.  Screening for Colon Cancer- Colonoscopy starting at age 21.  Screening may begin sooner depending on your family history and other health conditions. Follow up colonoscopy as directed by your Gastroenterologist.  Screening for Prostate Cancer- Both blood work (PSA) and a rectal exam help screen for Prostate Cancer.  Screening begins at age 36 with African-American men and at age 64 with Caucasian men.  Screening may begin sooner depending on your family history.  Take these medicines  Aspirin- One aspirin daily can help prevent Heart disease and Stroke.  Flu shot- Every fall.  Tetanus- Every 10 years.  Zostavax- Once after the age of 44 to prevent Shingles.  Pneumonia shot- Once after the age of 12; if you are younger than 74, ask your healthcare provider if you need a Pneumonia shot.  Take these steps  Don't smoke- If you do smoke, talk to your doctor about quitting.  For tips on how to quit, go to www.smokefree.gov or call 1-800-QUIT-NOW.  Be physically active- Exercise 5 days a week for at least 30 minutes.  If you are not already physically active start slow and gradually work up to 30 minutes of moderate physical activity.  Examples of moderate activity include walking briskly, mowing the yard, dancing, swimming, bicycling, etc.  Eat a healthy diet- Eat a variety of healthy food such as fruits, vegetables, low fat milk, low fat cheese, yogurt, lean meant, poultry, fish, beans, tofu, etc. For more information go to www.thenutritionsource.org  Drink alcohol in moderation- Limit alcohol intake to less than two  drinks a day. Never drink and drive.  Dentist- Brush and floss twice daily; visit your dentist twice a year.  Depression- Your emotional health is as important as your physical health. If you're feeling down, or losing interest in things you would normally enjoy please talk to your healthcare provider.  Eye exam- Visit your eye doctor every year.  Safe sex- If you may be exposed to a sexually transmitted infection, use a condom.  Seat belts- Seat belts can save your life; always wear one.  Smoke/Carbon Monoxide detectors- These detectors need to be installed on the appropriate level of your home.  Replace batteries at least once a year.  Skin cancer- When out in the sun, cover up and use sunscreen 15 SPF or higher.  Violence- If anyone is threatening you, please tell your healthcare provider.  Living Will/ Health care power of attorney- Speak with your healthcare provider and family.   If you have lab work done today you will be contacted with your lab results within the next 2 weeks.  If you have not heard from [place] then please contact us. The fastest way to get your results is to register for My Chart.   IF you received an x-ray today, you  will receive an invoice from [facility]. Please contact [facility] at [phone] with questions or concerns regarding your invoice.   IF you received labwork today, you will receive an invoice from [facility]. Please contact [facility] at [phone] with questions or concerns regarding your invoice.   Our billing staff will not be able to assist you with questions regarding bills from these companies.  You will be contacted with the lab results as soon as they are available. The fastest way to get your results is to activate your My Chart account. Instructions are located on the last page of this paperwork. If you have not heard from us regarding the results in 2 weeks, please contact this office.         Signed, Meredith StaggersJeffrey  Lashae Wollenberg, MD [facility]

## 2019-09-23 LAB — COMPREHENSIVE METABOLIC PANEL
ALT: 10 IU/L (ref 0–44)
AST: 21 IU/L (ref 0–40)
Albumin/Globulin Ratio: 1.7 (ref 1.2–2.2)
Albumin: 4.7 g/dL (ref 3.8–4.9)
Alkaline Phosphatase: 84 IU/L (ref 39–117)
BUN/Creatinine Ratio: 13 (ref 9–20)
BUN: 11 mg/dL (ref 6–24)
Bilirubin Total: 0.5 mg/dL (ref 0.0–1.2)
CO2: 21 mmol/L (ref 20–29)
Calcium: 9.7 mg/dL (ref 8.7–10.2)
Chloride: 101 mmol/L (ref 96–106)
Creatinine, Ser: 0.85 mg/dL (ref 0.76–1.27)
GFR calc Af Amer: 112 mL/min/{1.73_m2} (ref >59)
GFR calc non Af Amer: 97 mL/min/{1.73_m2} (ref >59)
Globulin, Total: 2.7 g/dL (ref 1.5–4.5)
Glucose: 90 mg/dL (ref 65–99)
Potassium: 4.1 mmol/L (ref 3.5–5.2)
Sodium: 137 mmol/L (ref 134–144)
Total Protein: 7.4 g/dL (ref 6.0–8.5)

## 2019-09-23 LAB — HEMOGLOBIN A1C
Est. average glucose Bld gHb Est-mCnc: 120 mg/dL
Hgb A1c MFr Bld: 5.8 % — ABNORMAL HIGH (ref 4.8–5.6)

## 2019-09-23 LAB — LIPID PANEL
Chol/HDL Ratio: 4.8 ratio (ref 0.0–5.0)
Cholesterol, Total: 150 mg/dL (ref 100–199)
HDL: 31 mg/dL — ABNORMAL LOW (ref >39)
LDL Chol Calc (NIH): 91 mg/dL (ref 0–99)
Triglycerides: 162 mg/dL — ABNORMAL HIGH (ref 0–149)
VLDL Cholesterol Cal: 28 mg/dL (ref 5–40)

## 2019-09-28 ENCOUNTER — Encounter: Payer: Self-pay | Admitting: Radiology

## 2019-09-28 ENCOUNTER — Ambulatory Visit: Payer: Self-pay | Attending: Internal Medicine

## 2019-09-28 DIAGNOSIS — Z23 Encounter for immunization: Secondary | ICD-10-CM

## 2019-09-28 NOTE — Progress Notes (Signed)
   Covid-19 Vaccination Clinic  Name:  Jonathan Bush    MRN: [id number] DOB: 03-27-1961  09/28/2019  [name] was observed post Covid-19 immunization for 15 minutes without incident. He was provided with Vaccine Information Sheet and instruction to access the V-Safe system.   [name] was instructed to call 911 with any severe reactions post vaccine: Marland Kitchen Difficulty breathing  . Swelling of face and throat  . A fast heartbeat  . A bad rash all over body  . Dizziness and weakness   Immunizations Administered    Name Date Dose VIS Date Route   Moderna COVID-19 Vaccine 09/28/2019  2:29 PM 0.5 mL 05/18/2019 Intramuscular   Manufacturer: Moderna   Lot: [id number]   NDC: 19012-224-11

## 2019-12-22 ENCOUNTER — Other Ambulatory Visit: Payer: Self-pay | Admitting: Family Medicine

## 2019-12-22 DIAGNOSIS — I10 Essential (primary) hypertension: Secondary | ICD-10-CM

## 2019-12-26 IMAGING — DX DG LUMBAR SPINE 2-3V
3 series · 3 of 3 positions shown · non-contrast
Comparison: None.

CLINICAL DATA: Low back pain and left-sided sciatica.

EXAM:
LUMBAR SPINE - 2-3 VIEW

[l-spine ap]
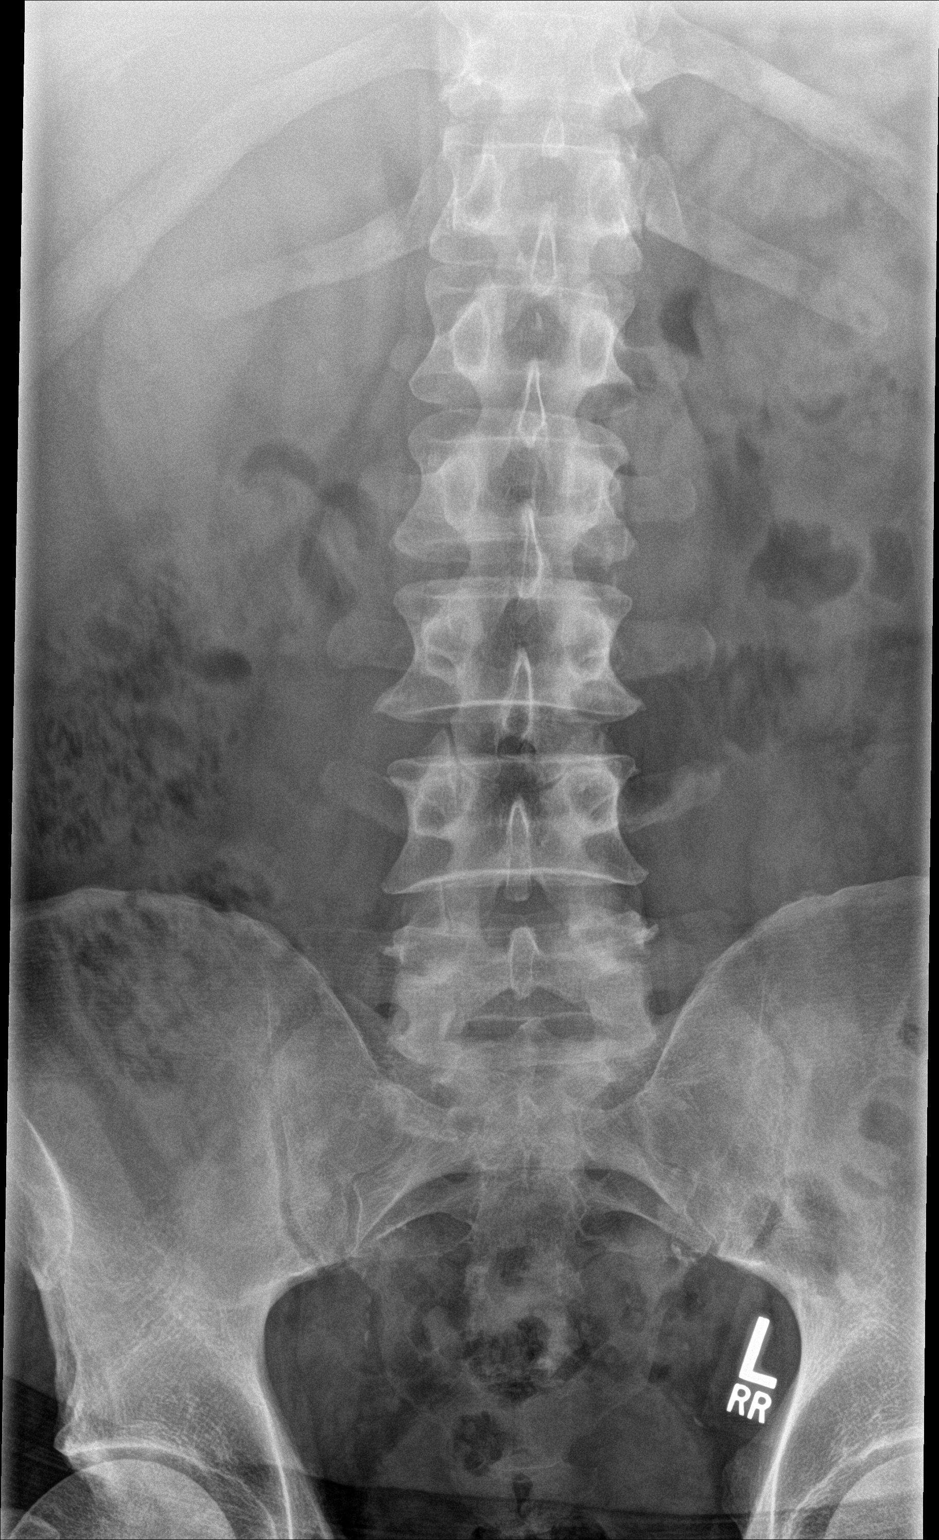

[l-spine lat]
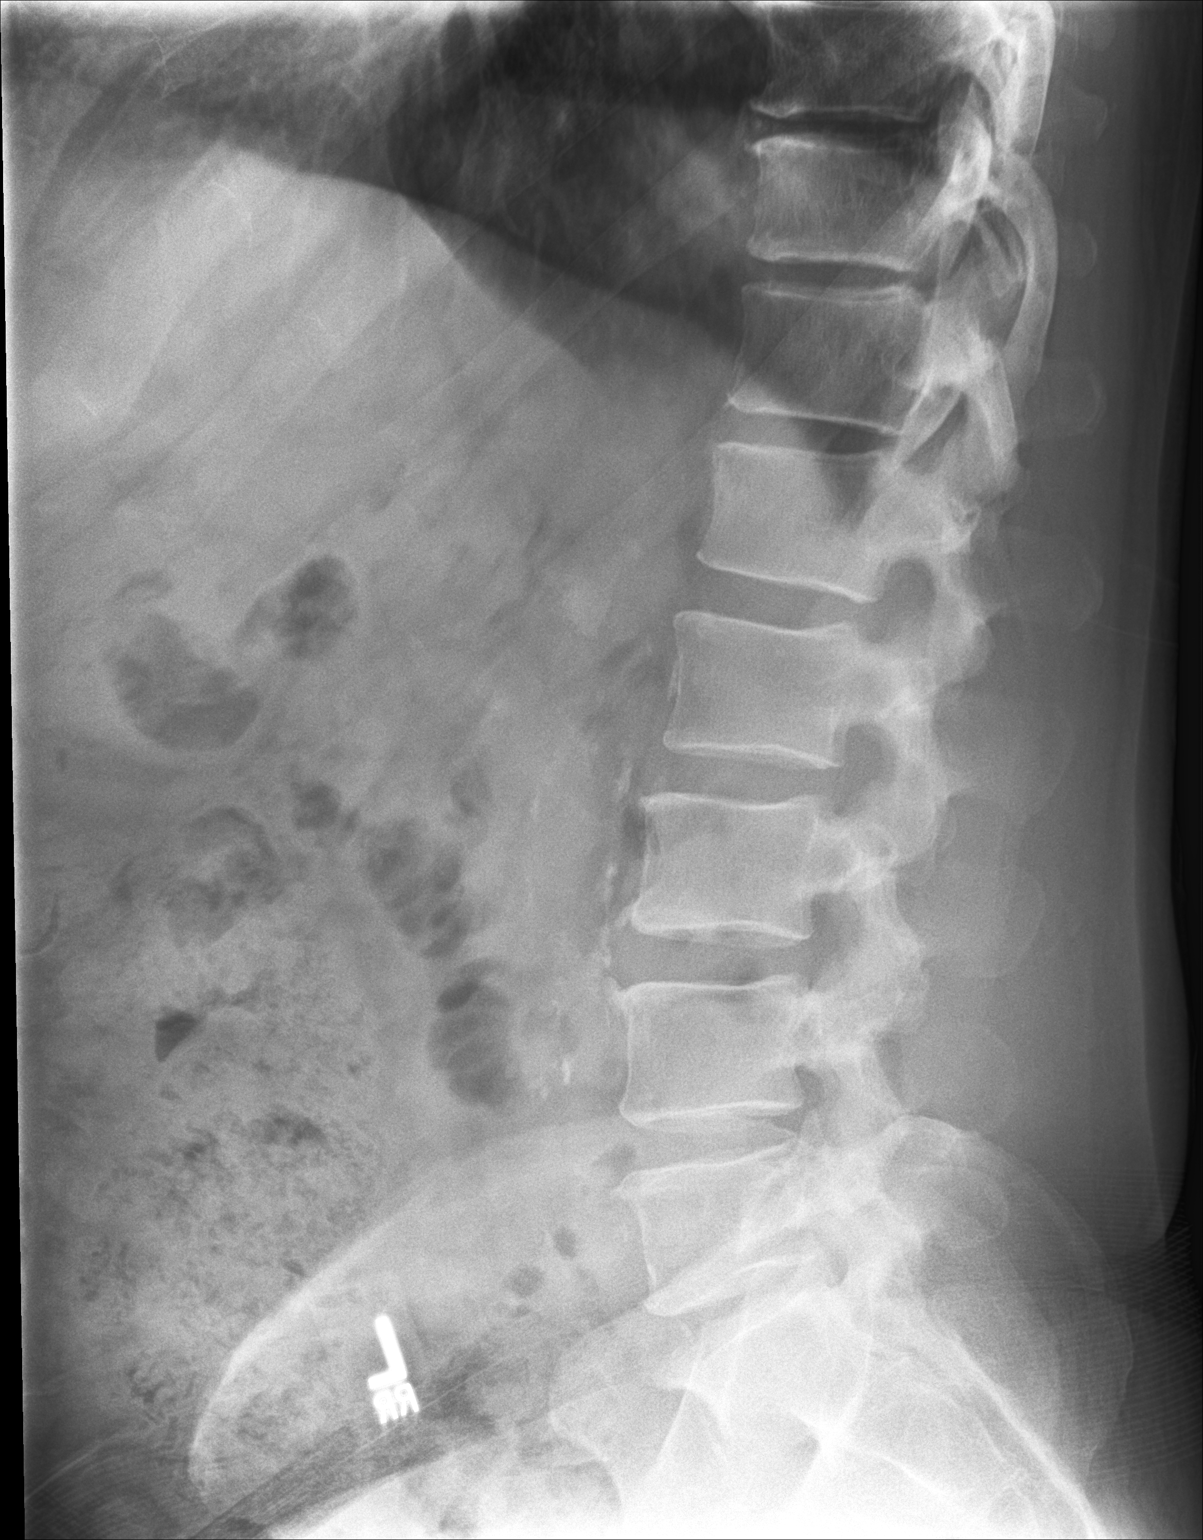

[l-spine l5-s1]
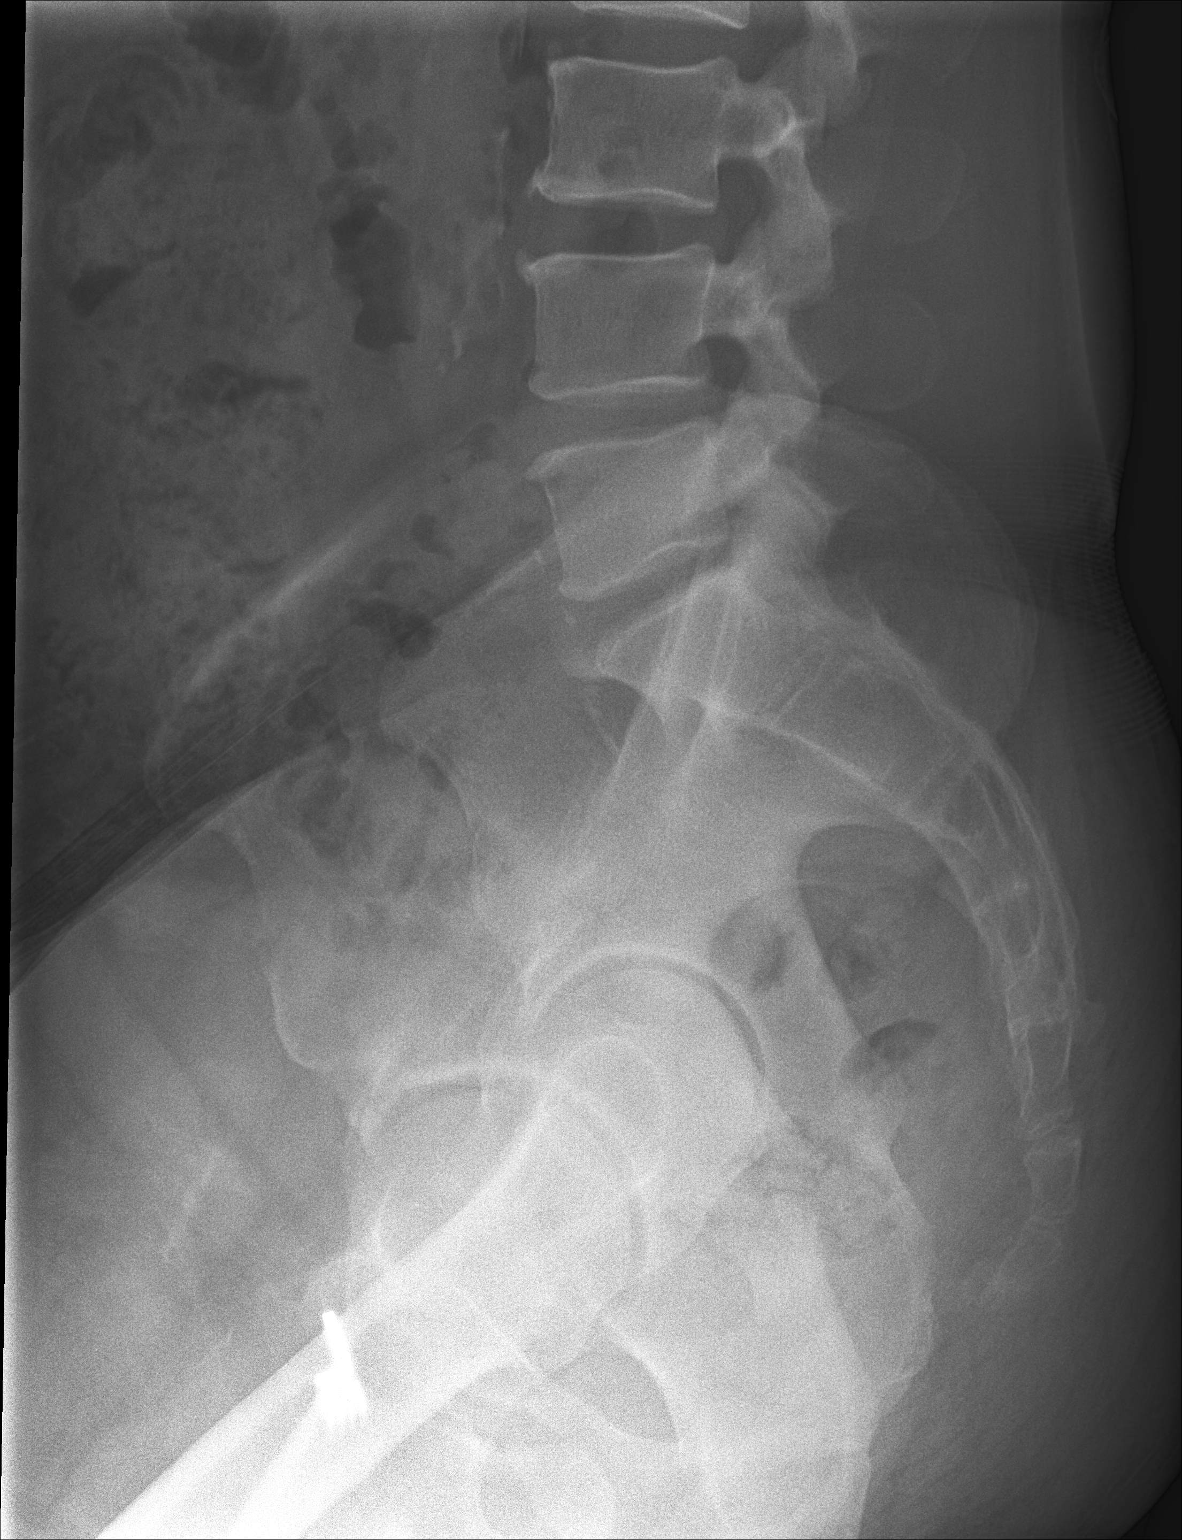

[3 of 3 positions shown; findings below may reference images not displayed]

FINDINGS: No fracture or spondylolisthesis is noted. Disc spaces appear to be
well maintained. Minimal anterior osteophyte formation is noted at
L3-4 and L4-5. Atherosclerosis of abdominal aorta is noted.
IMPRESSION: Minimal degenerative changes as described above. No acute
abnormality seen in the lumbar spine.

Aortic Atherosclerosis (UOER8-K17.7).

## 2020-03-17 ENCOUNTER — Other Ambulatory Visit: Payer: Self-pay | Admitting: Emergency Medicine

## 2020-03-17 ENCOUNTER — Telehealth: Payer: Self-pay | Admitting: Family Medicine

## 2020-03-17 ENCOUNTER — Other Ambulatory Visit: Payer: Self-pay | Admitting: Family Medicine

## 2020-03-17 DIAGNOSIS — I10 Essential (primary) hypertension: Secondary | ICD-10-CM

## 2020-03-17 DIAGNOSIS — E785 Hyperlipidemia, unspecified: Secondary | ICD-10-CM

## 2020-03-17 MED ORDER — ATORVASTATIN CALCIUM 10 MG PO TABS: 10.0000 mg | ORAL_TABLET | Freq: Every day | ORAL | 0 refills | Status: DC

## 2020-03-17 NOTE — Telephone Encounter (Signed)
called pt LVM for pt to c/b and reschedule ov that would have been on 03/24/2020 cancelled as Provider not available

## 2020-03-17 NOTE — Telephone Encounter (Signed)
Pt had an appt 03/24/2020 but needed to be rescheduled and is out of / patient has been taking 10 MG per day and script is written take 5 MG  , pt could not cut pil in 1/2.  What is the name of the medication? amLODipine (NORVASC) 10 MG tablet [[id number]   Have you contacted your pharmacy to request a refill? n  Which pharmacy would you like this sent to? [id number] [address]  [address], [place] [address]  Phone:  [phone] Fax:  [phone]    Patient notified that their request is being sent to the clinical staff for review and that they should receive a call once it is complete. If they do not receive a call within 72 hours they can check with their pharmacy or our office.

## 2020-03-17 NOTE — Telephone Encounter (Signed)
Dr Neva Seat last office visit notes stated patient was to increase amlodipine to 10 mg and this is the way patient has been taking this medication. Amlodipine 10 mg has been sent to the pharmacy.

## 2020-03-21 MED ORDER — AMLODIPINE BESYLATE 10 MG PO TABS: 5.0000 mg | ORAL_TABLET | Freq: Every day | ORAL | 2 refills | Status: DC

## 2020-03-21 NOTE — Telephone Encounter (Signed)
amLODipine (NORVASC) 10 MG tablet  Please refill ASAP. Pt states was going out of town this afternoon and was out of BP med, above, when he picked up med yesterday he discovered it was Liptor, his cholesterol med. He is leaving after lunch would you please put a rush on. States the dosage had changed on Norvasc and had caused him to run out prematurely.  [id number] [place], [place] - [address] Phone:  [phone]  Fax:  [phone]     Please rush!!!!

## 2020-03-21 NOTE — Telephone Encounter (Signed)
Please have this request reviewed by a nurse ASAP.  The pt. is requesting refill on Amlodipine ASAP.  Last refill was 09/22/19; he is going out of town this afternoon.  Please review and refill if possible.  The dosage has changed based on progress note of 09/22/19.  The script needs to be updated, to reflect Amlodipine 10 mg., take 1 tab qd. (script reads to take 0.5 tab qd)

## 2020-03-24 ENCOUNTER — Ambulatory Visit: Payer: BC Managed Care – PPO | Admitting: Family Medicine

## 2020-04-17 ENCOUNTER — Other Ambulatory Visit: Payer: Self-pay

## 2020-04-17 ENCOUNTER — Ambulatory Visit: Payer: BC Managed Care – PPO | Admitting: Family Medicine

## 2020-04-17 ENCOUNTER — Encounter: Payer: Self-pay | Admitting: Family Medicine

## 2020-04-17 VITALS — BP 138/68 | HR 76 | Temp 98.7°F | Ht 72.0 in | Wt 230.0 lb

## 2020-04-17 DIAGNOSIS — E785 Hyperlipidemia, unspecified: Secondary | ICD-10-CM

## 2020-04-17 DIAGNOSIS — I10 Essential (primary) hypertension: Secondary | ICD-10-CM

## 2020-04-17 DIAGNOSIS — R7303 Prediabetes: Secondary | ICD-10-CM

## 2020-04-17 DIAGNOSIS — Z6831 Body mass index (BMI) 31.0-31.9, adult: Secondary | ICD-10-CM | POA: Diagnosis not present

## 2020-04-17 DIAGNOSIS — F17210 Nicotine dependence, cigarettes, uncomplicated: Secondary | ICD-10-CM

## 2020-04-17 LAB — COMPREHENSIVE METABOLIC PANEL
ALT: 11 IU/L (ref 0–44)
AST: 18 IU/L (ref 0–40)
Albumin/Globulin Ratio: 1.6 (ref 1.2–2.2)
Albumin: 4.6 g/dL (ref 3.8–4.9)
Alkaline Phosphatase: 73 IU/L (ref 44–121)
BUN/Creatinine Ratio: 12 (ref 9–20)
BUN: 11 mg/dL (ref 6–24)
Bilirubin Total: <0.2 mg/dL (ref 0.0–1.2)
CO2: 22 mmol/L (ref 20–29)
Calcium: 9.5 mg/dL (ref 8.7–10.2)
Chloride: 101 mmol/L (ref 96–106)
Creatinine, Ser: 0.94 mg/dL (ref 0.76–1.27)
GFR calc Af Amer: 104 mL/min/{1.73_m2} (ref >59)
GFR calc non Af Amer: 90 mL/min/{1.73_m2} (ref >59)
Globulin, Total: 2.9 g/dL (ref 1.5–4.5)
Glucose: 106 mg/dL — ABNORMAL HIGH (ref 65–99)
Potassium: 4 mmol/L (ref 3.5–5.2)
Sodium: 139 mmol/L (ref 134–144)
Total Protein: 7.5 g/dL (ref 6.0–8.5)

## 2020-04-17 LAB — LIPID PANEL
Chol/HDL Ratio: 4.9 ratio (ref 0.0–5.0)
Cholesterol, Total: 152 mg/dL (ref 100–199)
HDL: 31 mg/dL — ABNORMAL LOW (ref >39)
LDL Chol Calc (NIH): 89 mg/dL (ref 0–99)
Triglycerides: 184 mg/dL — ABNORMAL HIGH (ref 0–149)
VLDL Cholesterol Cal: 32 mg/dL (ref 5–40)

## 2020-04-17 LAB — HEMOGLOBIN A1C
Est. average glucose Bld gHb Est-mCnc: 123 mg/dL
Hgb A1c MFr Bld: 5.9 % — ABNORMAL HIGH (ref 4.8–5.6)

## 2020-04-17 MED ORDER — AMLODIPINE BESYLATE 10 MG PO TABS: 10.0000 mg | ORAL_TABLET | Freq: Every day | ORAL | 2 refills | Status: DC

## 2020-04-17 MED ORDER — ATORVASTATIN CALCIUM 10 MG PO TABS: 10.0000 mg | ORAL_TABLET | Freq: Every day | ORAL | 2 refills | Status: DC

## 2020-04-17 MED ORDER — LISINOPRIL-HYDROCHLOROTHIAZIDE 20-12.5 MG PO TABS: 2.0000 | ORAL_TABLET | Freq: Every day | ORAL | 2 refills | Status: DC

## 2020-04-17 NOTE — Patient Instructions (Addendum)
I do recommend low dose CT scan for lung cancer screening - let me know if I can order that test. Good luck with quitting smoking - see info below.   Cut back to 2 sweet teas per day for next 2 weeks, then try to get back to one per day.   No med changes for now. Let me know if there are questions.    Steps to Quit Smoking Smoking tobacco is the leading cause of preventable death. It can affect almost every organ in the body. Smoking puts you and those around you at risk for developing many serious chronic diseases. Quitting smoking can be difficult, but it is one of the best things that you can do for your health. It is never too late to quit. How do I get ready to quit? When you decide to quit smoking, create a plan to help you succeed. Before you quit:  Pick a date to quit. Set a date within the next 2 weeks to give you time to prepare.  Write down the reasons why you are quitting. Keep this list in places where you will see it often.  Tell your family, friends, and co-workers that you are quitting. Support from your loved ones can make quitting easier.  Talk with your health care provider about your options for quitting smoking.  Find out what treatment options are covered by your health insurance.  Identify people, places, things, and activities that make you want to smoke (triggers). Avoid them. What first steps can I take to quit smoking?  Throw away all cigarettes at home, at work, and in your car.  Throw away smoking accessories, such as Set designer.  Clean your car. Make sure to empty the ashtray.  Clean your home, including curtains and carpets. What strategies can I use to quit smoking? Talk with your health care provider about combining strategies, such as taking medicines while you are also receiving in-person counseling. Using these two strategies together makes you more likely to succeed in quitting than if you used either strategy on its own.  If you  are pregnant or breastfeeding, talk with your health care provider about finding counseling or other support strategies to quit smoking. Do not take medicine to help you quit smoking unless your health care provider tells you to do so. To quit smoking: Quit right away  Quit smoking completely, instead of gradually reducing how much you smoke over a period of time. Research shows that stopping smoking right away is more successful than gradually quitting.  Attend in-person counseling to help you build problem-solving skills. You are more likely to succeed in quitting if you attend counseling sessions regularly. Even short sessions of 10 minutes can be effective. Take medicine You may take medicines to help you quit smoking. Some medicines require a prescription and some you can purchase over-the-counter. Medicines may have nicotine in them to replace the nicotine in cigarettes. Medicines may:  Help to stop cravings.  Help to relieve withdrawal symptoms. Your health care provider may recommend:  Nicotine patches, gum, or lozenges.  Nicotine inhalers or sprays.  Non-nicotine medicine that is taken by mouth. Find resources Find resources and support systems that can help you to quit smoking and remain smoke-free after you quit. These resources are most helpful when you use them often. They include:  Online chats with a Veterinary surgeon.  Telephone quitlines.  Printed Materials engineer.  Support groups or group counseling.  Text messaging programs.  Mobile  phone apps or applications. Use apps that can help you stick to your quit plan by providing reminders, tips, and encouragement. There are many free apps for mobile devices as well as websites. Examples include Quit Guide from the [employer] and smokefree.gov What things can I do to make it easier to quit?   Reach out to your family and friends for support and encouragement. Call telephone quitlines (1-800-QUIT-NOW), reach out to support groups,  or work with a counselor for support.  Ask people who smoke to avoid smoking around you.  Avoid places that trigger you to smoke, such as bars, parties, or smoke-break areas at work.  Spend time with people who do not smoke.  Lessen the stress in your life. Stress can be a smoking trigger for some people. To lessen stress, try: ? Exercising regularly. ? Doing deep-breathing exercises. ? Doing yoga. ? Meditating. ? Performing a body scan. This involves closing your eyes, scanning your body from head to toe, and noticing which parts of your body are particularly tense. Try to relax the muscles in those areas. How will I feel when I quit smoking? Day 1 to 3 weeks Within the first 24 hours of quitting smoking, you may start to feel withdrawal symptoms. These symptoms are usually most noticeable 2-3 days after quitting, but they usually do not last for more than 2-3 weeks. You may experience these symptoms:  Mood swings.  Restlessness, anxiety, or irritability.  Trouble concentrating.  Dizziness.  Strong cravings for sugary foods and nicotine.  Mild weight gain.  Constipation.  Nausea.  Coughing or a sore throat.  Changes in how the medicines that you take for unrelated issues work in your body.  Depression.  Trouble sleeping (insomnia). Week 3 and afterward After the first 2-3 weeks of quitting, you may start to notice more positive results, such as:  Improved sense of smell and taste.  Decreased coughing and sore throat.  Slower heart rate.  Lower blood pressure.  Clearer skin.  The ability to breathe more easily.  Fewer sick days. Quitting smoking can be very challenging. Do not get discouraged if you are not successful the first time. Some people need to make many attempts to quit before they achieve long-term success. Do your best to stick to your quit plan, and talk with your health care provider if you have any questions or concerns. Summary  Smoking  tobacco is the leading cause of preventable death. Quitting smoking is one of the best things that you can do for your health.  When you decide to quit smoking, create a plan to help you succeed.  Quit smoking right away, not slowly over a period of time.  When you start quitting, seek help from your health care provider, family, or friends. This information is not intended to replace advice given to you by your health care provider. Make sure you discuss any questions you have with your health care provider. Document Revised: 02/26/2019 Document Reviewed: 08/22/2018 Elsevier Patient Education  2020 [employer].    Coping with Quitting Smoking  Quitting smoking is a physical and mental challenge. You will face cravings, withdrawal symptoms, and temptation. Before quitting, work with your health care provider to make a plan that can help you cope. Preparation can help you quit and keep you from giving in. How can I cope with cravings? Cravings usually last for 5-10 minutes. If you get through it, the craving will pass. Consider taking the following actions to help you cope  with cravings:  Keep your mouth busy: ? Chew sugar-free gum. ? Suck on hard candies or a straw. ? Brush your teeth.  Keep your hands and body busy: ? Immediately change to a different activity when you feel a craving. ? Squeeze or play with a ball. ? Do an activity or a hobby, like making bead jewelry, practicing needlepoint, or working with wood. ? Mix up your normal routine. ? Take a short exercise break. Go for a quick walk or run up and down stairs. ? Spend time in public places where smoking is not allowed.  Focus on doing something kind or helpful for someone else.  Call a friend or family member to talk during a craving.  Join a support group.  Call a quit line, such as 1-800-QUIT-NOW.  Talk with your health care provider about medicines that might help you cope with cravings and make quitting easier  for you. How can I deal with withdrawal symptoms? Your body may experience negative effects as it tries to get used to not having nicotine in the system. These effects are called withdrawal symptoms. They may include:  Feeling hungrier than normal.  Trouble concentrating.  Irritability.  Trouble sleeping.  Feeling depressed.  Restlessness and agitation.  Craving a cigarette. To manage withdrawal symptoms:  Avoid places, people, and activities that trigger your cravings.  Remember why you want to quit.  Get plenty of sleep.  Avoid coffee and other caffeinated drinks. These may worsen some of your symptoms. How can I handle social situations? Social situations can be difficult when you are quitting smoking, especially in the first few weeks. To manage this, you can:  Avoid parties, bars, and other social situations where people might be smoking.  Avoid alcohol.  Leave right away if you have the urge to smoke.  Explain to your family and friends that you are quitting smoking. Ask for understanding and support.  Plan activities with friends or family where smoking is not an option. What are some ways I can cope with stress? Wanting to smoke may cause stress, and stress can make you want to smoke. Find ways to manage your stress. Relaxation techniques can help. For example:  Breathe slowly and deeply, in through your nose and out through your mouth.  Listen to soothing, relaxing music.  Talk with a family member or friend about your stress.  Light a candle.  Soak in a bath or take a shower.  Think about a peaceful place. What are some ways I can prevent weight gain? Be aware that many people gain weight after they quit smoking. However, not everyone does. To keep from gaining weight, have a plan in place before you quit and stick to the plan after you quit. Your plan should include:  Having healthy snacks. When you have a craving, it may help to: ? Eat plain  popcorn, crunchy carrots, celery, or other cut vegetables. ? Chew sugar-free gum.  Changing how you eat: ? Eat small portion sizes at meals. ? Eat 4-6 small meals throughout the day instead of 1-2 large meals a day. ? Be mindful when you eat. Do not watch television or do other things that might distract you as you eat.  Exercising regularly: ? Make time to exercise each day. If you do not have time for a long workout, do short bouts of exercise for 5-10 minutes several times a day. ? Do some form of strengthening exercise, like weight lifting, and some form of aerobic  exercise, like running or swimming.  Drinking plenty of water or other low-calorie or no-calorie drinks. Drink 6-8 glasses of water daily, or as much as instructed by your health care provider. Summary  Quitting smoking is a physical and mental challenge. You will face cravings, withdrawal symptoms, and temptation to smoke again. Preparation can help you as you go through these challenges.  You can cope with cravings by keeping your mouth busy (such as by chewing gum), keeping your body and hands busy, and making calls to family, friends, or a helpline for people who want to quit smoking.  You can cope with withdrawal symptoms by avoiding places where people smoke, avoiding drinks with caffeine, and getting plenty of rest.  Ask your health care provider about the different ways to prevent weight gain, avoid stress, and handle social situations. This information is not intended to replace advice given to you by your health care provider. Make sure you discuss any questions you have with your health care provider. Document Revised: 05/16/2017 Document Reviewed: 05/31/2016 Elsevier Patient Education  [employer].   If you have lab work done today you will be contacted with your lab results within the next 2 weeks.  If you have not heard from [place] then please contact us. The fastest way to get your results is to register  for My Chart.   IF you received an x-ray today, you will receive an invoice from [facility]. Please contact [facility] at [phone] with questions or concerns regarding your invoice.   IF you received labwork today, you will receive an invoice from [place]. Please contact [facility] at [phone] with questions or concerns regarding your invoice.   Our billing staff will not be able to assist you with questions regarding bills from these companies.  You will be contacted with the lab results as soon as they are available. The fastest way to get your results is to activate your My Chart account. Instructions are located on the last page of this paperwork. If you have not heard from [place] regarding the results in 2 weeks, please contact this office.

## 2020-04-17 NOTE — Progress Notes (Signed)
Subjective:  Patient ID: Jonathan Bush, male    DOB: 1961/01/03  Age: 59 y.o. MRN: [id number]  CC:  Chief Complaint  Patient presents with   Follow-up    on medical conditions and and medication. Pt reports he was told to take a hole tab of his amlodipine, but his scrips keep saying take .5 a tab. Pt has been taking a whole tab.    HPI BALDO HUFNAGLE presents for   Hypertension: Lisinopril HCTZ total of 40/25 mg daily.  Amlodipine 10 mg daily. Current regimen tolerated  No new side effects.  Home readings: rare - 130/60's.  BP Readings from Last 3 Encounters:  04/17/20 138/68  09/22/19 (!) 142/82  03/22/19 (!) 150/72   Lab Results  Component Value Date   CREATININE 0.85 09/22/2019    Hyperlipidemia: Lipitor 10 mg daily. No new myalgias, or other side effects.  Sweet tea this am.  Lab Results  Component Value Date   CHOL 150 09/22/2019   HDL 31 (L) 09/22/2019   LDLCALC 91 09/22/2019   TRIG 162 (H) 09/22/2019   CHOLHDL 4.8 09/22/2019   Lab Results  Component Value Date   ALT 10 09/22/2019   AST 21 09/22/2019   ALKPHOS 84 09/22/2019   BILITOT 0.5 09/22/2019   Nicotine addiction 1 pack/day smoker, cessation discussed previously.  Friend with lung cancer. Plans to quit now. Considering cessation on own - has done ok with stopping abruptly in the past - 3 months off.  Onset 59yo, - 35 pack year history. Offered low dose CT for lung CA screening - he would like to think about it.    Prediabetes: Body mass index is 31.19 kg/m.  Golfing for exercise, once per week, mowing grass.  Diet has been discussed in the past including avoidance of sweet tea and alcohol.  Weight has increased 4 pounds from April, 7 pounds from last October. Still drinking 3 sweet tea per day. Considering cutting back.    Wt Readings from Last 3 Encounters:  04/17/20 230 lb (104.3 kg)  09/22/19 226 lb (102.5 kg)  03/22/19 223 lb 6.4 oz (101.3 kg)    Lab Results  Component Value Date     HGBA1C 5.8 (H) 09/22/2019   HGBA1C 5.6 03/22/2019   HGBA1C 5.6 12/30/2017   HM: Plans to send in Cologuard.   History Patient Active Problem List   Diagnosis Date Noted   Lumbar pain 01/28/2018   Sciatica of left side 01/28/2018   Musculoskeletal pain 01/28/2018   Muscle spasm 01/28/2018   Essential hypertension 05/02/2017   Hyperlipidemia 05/02/2017   Prediabetes 05/02/2017   Past Medical History:  Diagnosis Date   Hypertension    Past Surgical History:  Procedure Laterality Date   VASECTOMY     No Known Allergies Prior to Admission medications   Medication Sig Start Date End Date Taking? Authorizing Provider  amLODipine (NORVASC) 10 MG tablet Take 0.5 tablets (5 mg total) by mouth daily. 03/21/20  Yes Shade Flood, MD  atorvastatin (LIPITOR) 10 MG tablet Take 1 tablet (10 mg total) by mouth daily. 03/17/20  Yes Shade Flood, MD  fish oil-omega-3 fatty acids 1000 MG capsule Take 2 g by mouth daily.   Yes [provider]  glucosamine-chondroitin 500-400 MG tablet Take 1 tablet by mouth 3 (three) times daily.   Yes [provider]  lisinopril-hydrochlorothiazide (ZESTORETIC) 20-12.5 MG tablet Take 2 tablets by mouth daily. 09/22/19  Yes Shade Flood, MD  Multiple Vitamin (MULTIVITAMIN) tablet Take 1 tablet by mouth daily.   Yes [provider]   Social History   Socioeconomic History   Marital status: Unknown    Spouse name: Not on file   Number of children: Not on file   Years of education: Not on file   Highest education level: Not on file  Occupational History   Occupation: Sales    Employer: [employer]  Tobacco Use   Smoking status: Current Every Day Smoker    Packs/day: 1.00    Years: 30.00    Pack years: 30.00    Types: Cigarettes   Smokeless tobacco: Never Used  Substance and Sexual Activity   Alcohol use: Yes    Alcohol/week: 12.0 standard drinks    Types: 12 Cans of beer per week    Comment:  beer   Drug use: No   Sexual activity: Yes    Partners: Female  Other Topics Concern   Not on file  Social History Narrative   Patient walks for exercise.   Social Determinants of Health   Financial Resource Strain:    Difficulty of Paying Living Expenses: Not on file  Food Insecurity:    Worried About Programme researcher, broadcasting/film/videounning Out of Food in the Last Year: Not on file   [employer] Out of Food in the Last Year: Not on file  Transportation Needs:    Lack of Transportation (Medical): Not on file   Lack of Transportation (Non-Medical): Not on file  Physical Activity:    Days of Exercise per Week: Not on file   Minutes of Exercise per Session: Not on file  Stress:    Feeling of Stress : Not on file  Social Connections:    Frequency of Communication with Friends and Family: Not on file   Frequency of Social Gatherings with Friends and Family: Not on file   Attends Religious Services: Not on file   Active Member of Clubs or Organizations: Not on file   Attends BankerClub or Organization Meetings: Not on file   Marital Status: Not on file  Intimate Partner Violence:    Fear of Current or Ex-Partner: Not on file   Emotionally Abused: Not on file   Physically Abused: Not on file   Sexually Abused: Not on file    Review of Systems  Constitutional: Negative for fatigue and unexpected weight change.  Eyes: Negative for visual disturbance.  Respiratory: Negative for cough, chest tightness and shortness of breath.   Cardiovascular: Negative for chest pain, palpitations and leg swelling.  Gastrointestinal: Negative for abdominal pain and blood in stool.  Neurological: Negative for dizziness, light-headedness and headaches.     Objective:   Vitals:   04/17/20 1012 04/17/20 1028  BP: (!) 160/84 138/68  Pulse: 76   Temp: 98.7 F (37.1 C)   TempSrc: Temporal   SpO2: 96%   Weight: 230 lb (104.3 kg)   Height: 6' (1.829 m)      Physical Exam Vitals reviewed.  Constitutional:       Appearance: He is well-developed.  HENT:     Head: Normocephalic and atraumatic.  Eyes:     Pupils: Pupils are equal, round, and reactive to light.  Neck:     Vascular: No carotid bruit or JVD.  Cardiovascular:     Rate and Rhythm: Normal rate and regular rhythm.     Heart sounds: Normal heart sounds. No murmur heard.   Pulmonary:     Effort: Pulmonary effort is normal.  Breath sounds: Normal breath sounds. No rales.  Skin:    General: Skin is warm and dry.  Neurological:     Mental Status: He is alert and oriented to person, place, and time.        Assessment & Plan:  OSUALDO HANSELL is a 59 y.o. male . Essential hypertension - Plan: Comprehensive metabolic panel, Lipid panel, lisinopril-hydrochlorothiazide (ZESTORETIC) 20-12.5 MG tablet, amLODipine (NORVASC) 10 MG tablet  -  Stable, tolerating current regimen. Medications refilled. Labs pending as above.   Prediabetes - Plan: Hemoglobin A1c  -Counseled on decreased sugar-containing beverages.  Check A1c.  BMI 31.0-31.9,adult  -As above, decrease sugar-containing beverages as initial approach.  Low intensity exercise.  Hyperlipidemia, unspecified hyperlipidemia type - Plan: Lipid panel, atorvastatin (LIPITOR) 10 MG tablet  -Tolerating current statin, triglycerides have been elevated previously, likely related to prediabetes and may be up as well with sweet tea this morning.  Cigarette nicotine dependence without complication  -Smoking cessation discussed, risks of discussed.  Handout given on steps to quitting smoking as well as coping with quitting smoking.  Declined any assistance at this time.  -Recommended low-dose CT for lung cancer screening, declined at this time but he will let me know if he chooses to have this done.   Does plan on sending in Cologuard for colon cancer screening.  Meds ordered this encounter  Medications   lisinopril-hydrochlorothiazide (ZESTORETIC) 20-12.5 MG tablet    Sig: Take 2 tablets  by mouth daily.    Dispense:  180 tablet    Refill:  2   amLODipine (NORVASC) 10 MG tablet    Sig: Take 1 tablet (10 mg total) by mouth daily.    Dispense:  90 tablet    Refill:  2    DX Code Needed  .   atorvastatin (LIPITOR) 10 MG tablet    Sig: Take 1 tablet (10 mg total) by mouth daily.    Dispense:  90 tablet    Refill:  2   Patient Instructions     I do recommend low dose CT scan for lung cancer screening - let me know if I can order that test. Good luck with quitting smoking - see info below.   Cut back to 2 sweet teas per day for next 2 weeks, then try to get back to one per day.   No med changes for now. Let me know if there are questions.    Steps to Quit Smoking Smoking tobacco is the leading cause of preventable death. It can affect almost every organ in the body. Smoking puts you and those around you at risk for developing many serious chronic diseases. Quitting smoking can be difficult, but it is one of the best things that you can do for your health. It is never too late to quit. How do I get ready to quit? When you decide to quit smoking, create a plan to help you succeed. Before you quit:  Pick a date to quit. Set a date within the next 2 weeks to give you time to prepare.  Write down the reasons why you are quitting. Keep this list in places where you will see it often.  Tell your family, friends, and co-workers that you are quitting. Support from your loved ones can make quitting easier.  Talk with your health care provider about your options for quitting smoking.  Find out what treatment options are covered by your health insurance.  Identify people, places, things, and activities that  make you want to smoke (triggers). Avoid them. What first steps can I take to quit smoking?  Throw away all cigarettes at home, at work, and in your car.  Throw away smoking accessories, such as Set designer.  Clean your car. Make sure to empty the  ashtray.  Clean your home, including curtains and carpets. What strategies can I use to quit smoking? Talk with your health care provider about combining strategies, such as taking medicines while you are also receiving in-person counseling. Using these two strategies together makes you more likely to succeed in quitting than if you used either strategy on its own.  If you are pregnant or breastfeeding, talk with your health care provider about finding counseling or other support strategies to quit smoking. Do not take medicine to help you quit smoking unless your health care provider tells you to do so. To quit smoking: Quit right away  Quit smoking completely, instead of gradually reducing how much you smoke over a period of time. Research shows that stopping smoking right away is more successful than gradually quitting.  Attend in-person counseling to help you build problem-solving skills. You are more likely to succeed in quitting if you attend counseling sessions regularly. Even short sessions of 10 minutes can be effective. Take medicine You may take medicines to help you quit smoking. Some medicines require a prescription and some you can purchase over-the-counter. Medicines may have nicotine in them to replace the nicotine in cigarettes. Medicines may:  Help to stop cravings.  Help to relieve withdrawal symptoms. Your health care provider may recommend:  Nicotine patches, gum, or lozenges.  Nicotine inhalers or sprays.  Non-nicotine medicine that is taken by mouth. Find resources Find resources and support systems that can help you to quit smoking and remain smoke-free after you quit. These resources are most helpful when you use them often. They include:  Online chats with a Veterinary surgeon.  Telephone quitlines.  Printed Materials engineer.  Support groups or group counseling.  Text messaging programs.  Mobile phone apps or applications. Use apps that can help you stick to  your quit plan by providing reminders, tips, and encouragement. There are many free apps for mobile devices as well as websites. Examples include Quit Guide from the [employer] and smokefree.gov What things can I do to make it easier to quit?   Reach out to your family and friends for support and encouragement. Call telephone quitlines (1-800-QUIT-NOW), reach out to support groups, or work with a counselor for support.  Ask people who smoke to avoid smoking around you.  Avoid places that trigger you to smoke, such as bars, parties, or smoke-break areas at work.  Spend time with people who do not smoke.  Lessen the stress in your life. Stress can be a smoking trigger for some people. To lessen stress, try: ? Exercising regularly. ? Doing deep-breathing exercises. ? Doing yoga. ? Meditating. ? Performing a body scan. This involves closing your eyes, scanning your body from head to toe, and noticing which parts of your body are particularly tense. Try to relax the muscles in those areas. How will I feel when I quit smoking? Day 1 to 3 weeks Within the first 24 hours of quitting smoking, you may start to feel withdrawal symptoms. These symptoms are usually most noticeable 2-3 days after quitting, but they usually do not last for more than 2-3 weeks. You may experience these symptoms:  Mood swings.  Restlessness, anxiety, or irritability.  Trouble concentrating.  Dizziness.  Strong cravings for sugary foods and nicotine.  Mild weight gain.  Constipation.  Nausea.  Coughing or a sore throat.  Changes in how the medicines that you take for unrelated issues work in your body.  Depression.  Trouble sleeping (insomnia). Week 3 and afterward After the first 2-3 weeks of quitting, you may start to notice more positive results, such as:  Improved sense of smell and taste.  Decreased coughing and sore throat.  Slower heart rate.  Lower blood pressure.  Clearer skin.  The ability  to breathe more easily.  Fewer sick days. Quitting smoking can be very challenging. Do not get discouraged if you are not successful the first time. Some people need to make many attempts to quit before they achieve long-term success. Do your best to stick to your quit plan, and talk with your health care provider if you have any questions or concerns. Summary  Smoking tobacco is the leading cause of preventable death. Quitting smoking is one of the best things that you can do for your health.  When you decide to quit smoking, create a plan to help you succeed.  Quit smoking right away, not slowly over a period of time.  When you start quitting, seek help from your health care provider, family, or friends. This information is not intended to replace advice given to you by your health care provider. Make sure you discuss any questions you have with your health care provider. Document Revised: 02/26/2019 Document Reviewed: 08/22/2018 Elsevier Patient Education  2020 [employer].    Coping with Quitting Smoking  Quitting smoking is a physical and mental challenge. You will face cravings, withdrawal symptoms, and temptation. Before quitting, work with your health care provider to make a plan that can help you cope. Preparation can help you quit and keep you from giving in. How can I cope with cravings? Cravings usually last for 5-10 minutes. If you get through it, the craving will pass. Consider taking the following actions to help you cope with cravings:  Keep your mouth busy: ? Chew sugar-free gum. ? Suck on hard candies or a straw. ? Brush your teeth.  Keep your hands and body busy: ? Immediately change to a different activity when you feel a craving. ? Squeeze or play with a ball. ? Do an activity or a hobby, like making bead jewelry, practicing needlepoint, or working with wood. ? Mix up your normal routine. ? Take a short exercise break. Go for a quick walk or run up and down  stairs. ? Spend time in public places where smoking is not allowed.  Focus on doing something kind or helpful for someone else.  Call a friend or family member to talk during a craving.  Join a support group.  Call a quit line, such as 1-800-QUIT-NOW.  Talk with your health care provider about medicines that might help you cope with cravings and make quitting easier for you. How can I deal with withdrawal symptoms? Your body may experience negative effects as it tries to get used to not having nicotine in the system. These effects are called withdrawal symptoms. They may include:  Feeling hungrier than normal.  Trouble concentrating.  Irritability.  Trouble sleeping.  Feeling depressed.  Restlessness and agitation.  Craving a cigarette. To manage withdrawal symptoms:  Avoid places, people, and activities that trigger your cravings.  Remember why you want to quit.  Get plenty of sleep.  Avoid coffee and other caffeinated drinks. These  may worsen some of your symptoms. How can I handle social situations? Social situations can be difficult when you are quitting smoking, especially in the first few weeks. To manage this, you can:  Avoid parties, bars, and other social situations where people might be smoking.  Avoid alcohol.  Leave right away if you have the urge to smoke.  Explain to your family and friends that you are quitting smoking. Ask for understanding and support.  Plan activities with friends or family where smoking is not an option. What are some ways I can cope with stress? Wanting to smoke may cause stress, and stress can make you want to smoke. Find ways to manage your stress. Relaxation techniques can help. For example:  Breathe slowly and deeply, in through your nose and out through your mouth.  Listen to soothing, relaxing music.  Talk with a family member or friend about your stress.  Light a candle.  Soak in a bath or take a shower.  Think  about a peaceful place. What are some ways I can prevent weight gain? Be aware that many people gain weight after they quit smoking. However, not everyone does. To keep from gaining weight, have a plan in place before you quit and stick to the plan after you quit. Your plan should include:  Having healthy snacks. When you have a craving, it may help to: ? Eat plain popcorn, crunchy carrots, celery, or other cut vegetables. ? Chew sugar-free gum.  Changing how you eat: ? Eat small portion sizes at meals. ? Eat 4-6 small meals throughout the day instead of 1-2 large meals a day. ? Be mindful when you eat. Do not watch television or do other things that might distract you as you eat.  Exercising regularly: ? Make time to exercise each day. If you do not have time for a long workout, do short bouts of exercise for 5-10 minutes several times a day. ? Do some form of strengthening exercise, like weight lifting, and some form of aerobic exercise, like running or swimming.  Drinking plenty of water or other low-calorie or no-calorie drinks. Drink 6-8 glasses of water daily, or as much as instructed by your health care provider. Summary  Quitting smoking is a physical and mental challenge. You will face cravings, withdrawal symptoms, and temptation to smoke again. Preparation can help you as you go through these challenges.  You can cope with cravings by keeping your mouth busy (such as by chewing gum), keeping your body and hands busy, and making calls to family, friends, or a helpline for people who want to quit smoking.  You can cope with withdrawal symptoms by avoiding places where people smoke, avoiding drinks with caffeine, and getting plenty of rest.  Ask your health care provider about the different ways to prevent weight gain, avoid stress, and handle social situations. This information is not intended to replace advice given to you by your health care provider. Make sure you discuss any  questions you have with your health care provider. Document Revised: 05/16/2017 Document Reviewed: 05/31/2016 Elsevier Patient Education  [employer].   If you have lab work done today you will be contacted with your lab results within the next 2 weeks.  If you have not heard from [place] then please contact us. The fastest way to get your results is to register for My Chart.   IF you received an x-ray today, you will receive an invoice from [facility]. Please contact [facility] at [phone] with questions or concerns regarding your invoice.   IF you received labwork today, you will receive an invoice from [facility]. Please contact [facility] at [phone] with questions or concerns regarding your invoice.   Our billing staff will not be able to assist you with questions regarding bills from these companies.  You will be contacted with the lab results as soon as they are available. The fastest way to get your results is to activate your My Chart account. Instructions are located on the last page of this paperwork. If you have not heard from [place] regarding the results in 2 weeks, please contact this office.         Signed, [name], MD [facility]

## 2020-04-19 ENCOUNTER — Encounter: Payer: Self-pay | Admitting: Radiology

## 2020-05-31 ENCOUNTER — Encounter: Payer: Self-pay | Admitting: Family Medicine

## 2020-05-31 ENCOUNTER — Ambulatory Visit: Payer: BC Managed Care – PPO | Admitting: Family Medicine

## 2020-05-31 ENCOUNTER — Other Ambulatory Visit: Payer: Self-pay

## 2020-05-31 ENCOUNTER — Encounter: Payer: BC Managed Care – PPO | Admitting: Family Medicine

## 2020-05-31 VITALS — BP 145/81 | HR 69 | Temp 98.7°F | Ht 72.0 in | Wt 227.0 lb

## 2020-05-31 DIAGNOSIS — H00024 Hordeolum internum left upper eyelid: Secondary | ICD-10-CM | POA: Diagnosis not present

## 2020-05-31 MED ORDER — ERYTHROMYCIN 5 MG/GM OP OINT: 1.0000 "application " | TOPICAL_OINTMENT | Freq: Three times a day (TID) | OPHTHALMIC | 0 refills | Status: DC

## 2020-05-31 NOTE — Progress Notes (Signed)
Subjective:  Patient ID: Jonathan Bush, male    DOB: 1960-10-22  Age: 59 y.o. MRN: [id number]  CC: No chief complaint on file.   HPI Jonathan Bush presents for   Stye on Left eye. Started 2 days ago. Tried warm compresses last night.  No difficulty seeing. Sore on upper lid. Less sore now than this am. Used warm compresses. No f/c.   Tx: warm compress.   No exam data present    History Patient Active Problem List   Diagnosis Date Noted  . Lumbar pain 01/28/2018  . Sciatica of left side 01/28/2018  . Musculoskeletal pain 01/28/2018  . Muscle spasm 01/28/2018  . Essential hypertension 05/02/2017  . Hyperlipidemia 05/02/2017  . Prediabetes 05/02/2017   Past Medical History:  Diagnosis Date  . Hypertension    Past Surgical History:  Procedure Laterality Date  . VASECTOMY     No Known Allergies Prior to Admission medications   Medication Sig Start Date End Date Taking? Authorizing Provider  amLODipine (NORVASC) 10 MG tablet Take 1 tablet (10 mg total) by mouth daily. 04/17/20   Shade Flood, MD  atorvastatin (LIPITOR) 10 MG tablet Take 1 tablet (10 mg total) by mouth daily. 04/17/20   Shade Flood, MD  fish oil-omega-3 fatty acids 1000 MG capsule Take 2 g by mouth daily.    [provider]  glucosamine-chondroitin 500-400 MG tablet Take 1 tablet by mouth 3 (three) times daily.    [provider]  lisinopril-hydrochlorothiazide (ZESTORETIC) 20-12.5 MG tablet Take 2 tablets by mouth daily. 04/17/20   Shade Flood, MD  Multiple Vitamin (MULTIVITAMIN) tablet Take 1 tablet by mouth daily.    [provider]   Social History   Socioeconomic History  . Marital status: Unknown    Spouse name: Not on file  . Number of children: Not on file  . Years of education: Not on file  . Highest education level: Not on file  Occupational History  . Occupation: Investment banker, corporate: [employer]  Tobacco Use  . Smoking status: Current Every Day  Smoker    Packs/day: 1.00    Years: 30.00    Pack years: 30.00    Types: Cigarettes  . Smokeless tobacco: Never Used  Substance and Sexual Activity  . Alcohol use: Yes    Alcohol/week: 12.0 standard drinks    Types: 12 Cans of beer per week    Comment: beer  . Drug use: No  . Sexual activity: Yes    Partners: Female  Other Topics Concern  . Not on file  Social History Narrative   Patient walks for exercise.   Social Determinants of Health   Financial Resource Strain: Not on file  Food Insecurity: Not on file  Transportation Needs: Not on file  Physical Activity: Not on file  Stress: Not on file  Social Connections: Not on file  Intimate Partner Violence: Not on file    Review of Systems Per hpi.   Objective:   Vitals:   05/31/20 0932  BP: (!) 145/81  Pulse: 69  Temp: 98.7 F (37.1 C)  SpO2: 96%  Weight: 227 lb (103 kg)  Height: 6' (1.829 m)     Physical Exam Vitals reviewed.  Constitutional:      General: He is not in acute distress.    Appearance: He is well-developed and well-nourished.  HENT:     Head: Normocephalic and atraumatic.  Eyes:     Extraocular  Movements: Extraocular movements intact.     Pupils: Pupils are equal, round, and reactive to light.  Cardiovascular:     Rate and Rhythm: Normal rate.  Pulmonary:     Effort: Pulmonary effort is normal.  Neurological:     Mental Status: He is alert and oriented to person, place, and time.  Psychiatric:        Mood and Affect: Mood and affect normal.          Assessment & Plan:  Jonathan Bush is a 59 y.o. male . Hordeolum internum left upper eyelid - Plan: erythromycin ophthalmic ointment   -Appears to be in control stye, no apparent signs of preseptal cellulitis or periorbital cellulitis at this time.  Will cover with erythromycin ointment 3 times per day, half-inch ribbon discussed.  Technique discussed.  Warm compresses, RTC/ophthalmology follow-up precautions discussed.  Meds  ordered this encounter  Medications  . erythromycin ophthalmic ointment    Sig: Place 1 application into the left eye 3 (three) times daily. For 1 week.    Dispense:  3.5 g    Refill:  0   Patient Instructions       If you have lab work done today you will be contacted with your lab results within the next 2 weeks.  If you have not heard from [place] then please contact us. The fastest way to get your results is to register for My Chart.   IF you received an x-ray today, you will receive an invoice from [facility]. Please contact [facility] at [phone] with questions or concerns regarding your invoice.   IF you received labwork today, you will receive an invoice from [facility]. Please contact [facility] at [phone] with questions or concerns regarding your invoice.   Our billing staff will not be able to assist you with questions regarding bills from these companies.  You will be contacted with the lab results as soon as they are available. The fastest way to get your results is to activate your My Chart account. Instructions are located on the last page of this paperwork. If you have not heard from [place] regarding the results in 2 weeks, please contact this office.    Warm compresses frequently, ointment 3 times per day. If redness spreading to other areas or worse - recheck here or with your eye specialist.   Return to the clinic or go to the nearest emergency room if any of your symptoms worsen or new symptoms occur.   Stye  A stye, also known as a hordeolum, is a bump that forms on an eyelid. It may look like a pimple next to the eyelash. A stye can form inside the eyelid (internal stye) or outside the eyelid (external stye). A stye can cause redness, swelling, and pain on the eyelid. Styes are very common. Anyone can get them at any age. They usually occur in just one eye, but you may have more than one in either eye. What are the causes? A stye is caused by  an infection. The infection is almost always caused by bacteria called Staphylococcus aureus. This is a common type of bacteria that lives on the skin. An internal stye may result from an infected oil-producing gland inside the eyelid. An external stye may be caused by an infection at the base of the eyelash (hair follicle). What increases the risk? You are more likely to develop a stye if:  You have had a stye before.  You have any of  these conditions: ? Diabetes. ? Red, itchy, inflamed eyelids (blepharitis). ? A skin condition such as seborrheic dermatitis or rosacea. ? High fat levels in your blood (lipids). What are the signs or symptoms? The most common symptom of a stye is eyelid pain. Internal styes are more painful than external styes. Other symptoms may include:  Painful swelling of your eyelid.  A scratchy feeling in your eye.  Tearing and redness of your eye.  Pus draining from the stye. How is this diagnosed? Your health care provider may be able to diagnose a stye just by examining your eye. The health care provider may also check to make sure:  You do not have a fever or other signs of a more serious infection.  The infection has not spread to other parts of your eye or areas around your eye. How is this treated? Most styes will clear up in a few days without treatment or with warm compresses applied to the area. You may need to use antibiotic drops or ointment to treat an infection. In some cases, if your stye does not heal with routine treatment, your health care provider may drain pus from the stye using a thin blade or needle. This may be done if the stye is large, causing a lot of pain, or affecting your vision. Follow these instructions at home:  Take over-the-counter and prescription medicines only as told by your health care provider. This includes eye drops or ointments.  If you were prescribed an antibiotic medicine, apply or use it as told by your health  care provider. Do not stop using the antibiotic even if your condition improves.  Apply a warm, wet cloth (warm compress) to your eye for 5-10 minutes, 4 times a day.  Clean the affected eyelid as directed by your health care provider.  Do not wear contact lenses or eye makeup until your stye has healed.  Do not try to pop or drain the stye.  Do not rub your eye. Contact a health care provider if:  You have chills or a fever.  Your stye does not go away after several days.  Your stye affects your vision.  Your eyeball becomes swollen, red, or painful. Get help right away if:  You have pain when moving your eye around. Summary  A stye is a bump that forms on an eyelid. It may look like a pimple next to the eyelash.  A stye can form inside the eyelid (internal stye) or outside the eyelid (external stye). A stye can cause redness, swelling, and pain on the eyelid.  Your health care provider may be able to diagnose a stye just by examining your eye.  Apply a warm, wet cloth (warm compress) to your eye for 5-10 minutes, 4 times a day. This information is not intended to replace advice given to you by your health care provider. Make sure you discuss any questions you have with your health care provider. Document Revised: 05/16/2017 Document Reviewed: 02/13/2017 Elsevier Patient Education  2020 [employer].      Signed, [name], MD [facility]

## 2020-05-31 NOTE — Patient Instructions (Signed)
° ° ° °  If you have lab work done today you will be contacted with your lab results within the next 2 weeks.  If you have not heard from us then please contact us. The fastest way to get your results is to register for My Chart. ° ° °IF you received an x-ray today, you will receive an invoice from [facility]. Please contact [facility] at [phone] with questions or concerns regarding your invoice.  ° °IF you received labwork today, you will receive an invoice from [facility]. Please contact [facility] at [phone] with questions or concerns regarding your invoice.  ° °Our billing staff will not be able to assist you with questions regarding bills from these companies. ° °You will be contacted with the lab results as soon as they are available. The fastest way to get your results is to activate your My Chart account. Instructions are located on the last page of this paperwork. If you have not heard from us regarding the results in 2 weeks, please contact this office. °  ° ° ° °

## 2020-05-31 NOTE — Patient Instructions (Addendum)
If you have lab work done today you will be contacted with your lab results within the next 2 weeks.  If you have not heard from [place] then please contact us. The fastest way to get your results is to register for My Chart.   IF you received an x-ray today, you will receive an invoice from [facility]. Please contact [facility] at [phone] with questions or concerns regarding your invoice.   IF you received labwork today, you will receive an invoice from [facility]. Please contact [facility] at [phone] with questions or concerns regarding your invoice.   Our billing staff will not be able to assist you with questions regarding bills from these companies.  You will be contacted with the lab results as soon as they are available. The fastest way to get your results is to activate your My Chart account. Instructions are located on the last page of this paperwork. If you have not heard from [place] regarding the results in 2 weeks, please contact this office.    Warm compresses frequently, ointment 3 times per day. If redness spreading to other areas or worse - recheck here or with your eye specialist.   Return to the clinic or go to the nearest emergency room if any of your symptoms worsen or new symptoms occur.   Stye  A stye, also known as a hordeolum, is a bump that forms on an eyelid. It may look like a pimple next to the eyelash. A stye can form inside the eyelid (internal stye) or outside the eyelid (external stye). A stye can cause redness, swelling, and pain on the eyelid. Styes are very common. Anyone can get them at any age. They usually occur in just one eye, but you may have more than one in either eye. What are the causes? A stye is caused by an infection. The infection is almost always caused by bacteria called Staphylococcus aureus. This is a common type of bacteria that lives on the skin. An internal stye may result from an infected oil-producing gland  inside the eyelid. An external stye may be caused by an infection at the base of the eyelash (hair follicle). What increases the risk? You are more likely to develop a stye if:  You have had a stye before.  You have any of these conditions: ? Diabetes. ? Red, itchy, inflamed eyelids (blepharitis). ? A skin condition such as seborrheic dermatitis or rosacea. ? High fat levels in your blood (lipids). What are the signs or symptoms? The most common symptom of a stye is eyelid pain. Internal styes are more painful than external styes. Other symptoms may include:  Painful swelling of your eyelid.  A scratchy feeling in your eye.  Tearing and redness of your eye.  Pus draining from the stye. How is this diagnosed? Your health care provider may be able to diagnose a stye just by examining your eye. The health care provider may also check to make sure:  You do not have a fever or other signs of a more serious infection.  The infection has not spread to other parts of your eye or areas around your eye. How is this treated? Most styes will clear up in a few days without treatment or with warm compresses applied to the area. You may need to use antibiotic drops or ointment to treat an infection. In some cases, if your stye does not heal with routine treatment, your health care provider may drain pus  from the stye using a thin blade or needle. This may be done if the stye is large, causing a lot of pain, or affecting your vision. Follow these instructions at home:  Take over-the-counter and prescription medicines only as told by your health care provider. This includes eye drops or ointments.  If you were prescribed an antibiotic medicine, apply or use it as told by your health care provider. Do not stop using the antibiotic even if your condition improves.  Apply a warm, wet cloth (warm compress) to your eye for 5-10 minutes, 4 times a day.  Clean the affected eyelid as directed by your  health care provider.  Do not wear contact lenses or eye makeup until your stye has healed.  Do not try to pop or drain the stye.  Do not rub your eye. Contact a health care provider if:  You have chills or a fever.  Your stye does not go away after several days.  Your stye affects your vision.  Your eyeball becomes swollen, red, or painful. Get help right away if:  You have pain when moving your eye around. Summary  A stye is a bump that forms on an eyelid. It may look like a pimple next to the eyelash.  A stye can form inside the eyelid (internal stye) or outside the eyelid (external stye). A stye can cause redness, swelling, and pain on the eyelid.  Your health care provider may be able to diagnose a stye just by examining your eye.  Apply a warm, wet cloth (warm compress) to your eye for 5-10 minutes, 4 times a day. This information is not intended to replace advice given to you by your health care provider. Make sure you discuss any questions you have with your health care provider. Document Revised: 05/16/2017 Document Reviewed: 02/13/2017 Elsevier Patient Education  2020 [employer].

## 2020-06-20 ENCOUNTER — Telehealth: Payer: Self-pay | Admitting: Family Medicine

## 2020-06-20 NOTE — Telephone Encounter (Signed)
Medication Refill - Medication: Amlodipine 10 mg  Has the patient contacted their pharmacy? No. (Agent: If no, request that the patient contact the pharmacy for the refill.) (Agent: If yes, when and what did the pharmacy advise?)  Preferred Pharmacy (with phone number or street name): [facility] [address] Agent: Please be advised that RX refills may take up to 3 business days. We ask that you follow-up with your pharmacy.

## 2020-06-21 ENCOUNTER — Other Ambulatory Visit: Payer: Self-pay

## 2020-06-21 DIAGNOSIS — I10 Essential (primary) hypertension: Secondary | ICD-10-CM

## 2020-06-21 MED ORDER — AMLODIPINE BESYLATE 10 MG PO TABS: 10.0000 mg | ORAL_TABLET | Freq: Every day | ORAL | 2 refills | Status: DC

## 2020-10-17 ENCOUNTER — Other Ambulatory Visit: Payer: Self-pay

## 2020-10-18 ENCOUNTER — Encounter: Payer: Self-pay | Admitting: Family Medicine

## 2020-10-18 ENCOUNTER — Ambulatory Visit (INDEPENDENT_AMBULATORY_CARE_PROVIDER_SITE_OTHER): Payer: BC Managed Care – PPO | Admitting: Family Medicine

## 2020-10-18 ENCOUNTER — Other Ambulatory Visit: Payer: Self-pay

## 2020-10-18 VITALS — BP 156/82 | HR 70 | Temp 98.0°F | Resp 18 | Ht 72.0 in | Wt 228.4 lb

## 2020-10-18 DIAGNOSIS — E785 Hyperlipidemia, unspecified: Secondary | ICD-10-CM

## 2020-10-18 DIAGNOSIS — R7303 Prediabetes: Secondary | ICD-10-CM | POA: Diagnosis not present

## 2020-10-18 DIAGNOSIS — I10 Essential (primary) hypertension: Secondary | ICD-10-CM

## 2020-10-18 DIAGNOSIS — Z125 Encounter for screening for malignant neoplasm of prostate: Secondary | ICD-10-CM

## 2020-10-18 DIAGNOSIS — Z130 Encounter for screening for diseases of the blood and blood-forming organs and certain disorders involving the immune mechanism: Secondary | ICD-10-CM | POA: Diagnosis not present

## 2020-10-18 DIAGNOSIS — Z0000 Encounter for general adult medical examination without abnormal findings: Secondary | ICD-10-CM | POA: Diagnosis not present

## 2020-10-18 DIAGNOSIS — Z1211 Encounter for screening for malignant neoplasm of colon: Secondary | ICD-10-CM

## 2020-10-18 DIAGNOSIS — Z1329 Encounter for screening for other suspected endocrine disorder: Secondary | ICD-10-CM

## 2020-10-18 DIAGNOSIS — E669 Obesity, unspecified: Secondary | ICD-10-CM

## 2020-10-18 LAB — LIPID PANEL
Cholesterol: 141 mg/dL (ref 0–200)
HDL: 28.5 mg/dL — ABNORMAL LOW (ref >39.00)
LDL Cholesterol: 75 mg/dL (ref 0–99)
NonHDL: 112.61
Total CHOL/HDL Ratio: 5
Triglycerides: 187 mg/dL — ABNORMAL HIGH (ref 0.0–149.0)
VLDL: 37.4 mg/dL (ref 0.0–40.0)

## 2020-10-18 LAB — CBC WITH DIFFERENTIAL/PLATELET
Basophils Absolute: 0.1 10*3/uL (ref 0.0–0.1)
Basophils Relative: 1 % (ref 0.0–3.0)
Eosinophils Absolute: 0.3 10*3/uL (ref 0.0–0.7)
Eosinophils Relative: 2.9 % (ref 0.0–5.0)
HCT: 49.9 % (ref 39.0–52.0)
Hemoglobin: 17.1 g/dL — ABNORMAL HIGH (ref 13.0–17.0)
Lymphocytes Relative: 29 % (ref 12.0–46.0)
Lymphs Abs: 2.7 10*3/uL (ref 0.7–4.0)
MCHC: 34.3 g/dL (ref 30.0–36.0)
MCV: 89.1 fl (ref 78.0–100.0)
Monocytes Absolute: 0.8 10*3/uL (ref 0.1–1.0)
Monocytes Relative: 8.1 % (ref 3.0–12.0)
Neutro Abs: 5.5 10*3/uL (ref 1.4–7.7)
Neutrophils Relative %: 59 % (ref 43.0–77.0)
Platelets: 262 10*3/uL (ref 150.0–400.0)
RBC: 5.6 Mil/uL (ref 4.22–5.81)
RDW: 13.9 % (ref 11.5–15.5)
WBC: 9.3 10*3/uL (ref 4.0–10.5)

## 2020-10-18 LAB — PSA: PSA: 0.5 ng/mL (ref 0.10–4.00)

## 2020-10-18 LAB — HEMOGLOBIN A1C: Hgb A1c MFr Bld: 6.1 % (ref 4.6–6.5)

## 2020-10-18 LAB — COMPREHENSIVE METABOLIC PANEL
ALT: 13 U/L (ref 0–53)
AST: 18 U/L (ref 0–37)
Albumin: 4.5 g/dL (ref 3.5–5.2)
Alkaline Phosphatase: 69 U/L (ref 39–117)
BUN: 19 mg/dL (ref 6–23)
CO2: 27 mEq/L (ref 19–32)
Calcium: 9.7 mg/dL (ref 8.4–10.5)
Chloride: 104 mEq/L (ref 96–112)
Creatinine, Ser: 0.87 mg/dL (ref 0.40–1.50)
GFR: 95.3 mL/min (ref >60.00)
Glucose, Bld: 110 mg/dL — ABNORMAL HIGH (ref 70–99)
Potassium: 3.9 mEq/L (ref 3.5–5.1)
Sodium: 138 mEq/L (ref 135–145)
Total Bilirubin: 0.4 mg/dL (ref 0.2–1.2)
Total Protein: 7.3 g/dL (ref 6.0–8.3)

## 2020-10-18 LAB — TSH: TSH: 1.64 u[IU]/mL (ref 0.35–4.50)

## 2020-10-18 MED ORDER — ATORVASTATIN CALCIUM 10 MG PO TABS: 10.0000 mg | ORAL_TABLET | Freq: Every day | ORAL | 2 refills | Status: DC

## 2020-10-18 MED ORDER — AMLODIPINE BESYLATE 10 MG PO TABS: 10.0000 mg | ORAL_TABLET | Freq: Every day | ORAL | 2 refills | Status: DC

## 2020-10-18 MED ORDER — LISINOPRIL-HYDROCHLOROTHIAZIDE 20-12.5 MG PO TABS: 2.0000 | ORAL_TABLET | Freq: Every day | ORAL | 2 refills | Status: DC

## 2020-10-18 NOTE — Patient Instructions (Addendum)
No change in meds today, but monitor home blood pressures and if running over 130/80 let me know so we can adjust meds.  Keep up the good work with exercise and watching diet. Try to cut back on some of the alcoholic drinks. Follow up in 6 months, but let me know if there are questions sooner.   Keeping you healthy  Get these tests  Blood pressure- Have your blood pressure checked once a year by your healthcare provider.  Normal blood pressure is 120/80  Weight- Have your body mass index (BMI) calculated to screen for obesity.  BMI is a measure of body fat based on height and weight. You can also calculate your own BMI at [email or web address].  Cholesterol- Have your cholesterol checked every year.  Diabetes- Have your blood sugar checked regularly if you have high blood pressure, high cholesterol, have a family history of diabetes or if you are overweight.  Screening for Colon Cancer- Colonoscopy starting at age 39.  Screening may begin sooner depending on your family history and other health conditions. Follow up colonoscopy as directed by your Gastroenterologist.  Screening for Prostate Cancer- Both blood work (PSA) and a rectal exam help screen for Prostate Cancer.  Screening begins at age 37 with African-American men and at age 85 with Caucasian men.  Screening may begin sooner depending on your family history.   Take these medicines  Flu shot- Every fall.  Tetanus- Every 10 years.  Shingrix- twice after the age of 31 to prevent Shingles.  Pneumonia shot- Once after the age of 47; if you are younger than 63, ask your healthcare provider if you need a Pneumonia shot.  Take these steps  Don't smoke- If you do smoke, talk to your doctor about quitting.  For tips on how to quit, go to www.smokefree.gov or call 1-800-QUIT-NOW.  Be physically active- Exercise 5 days a week for at least 30 minutes.  If you are not already physically active start slow and gradually work up to 30  minutes of moderate physical activity.  Examples of moderate activity include walking briskly, mowing the yard, dancing, swimming, bicycling, etc.  Eat a healthy diet- Eat a variety of healthy food such as fruits, vegetables, low fat milk, low fat cheese, yogurt, lean meant, poultry, fish, beans, tofu, etc. For more information go to www.thenutritionsource.org  Drink alcohol in moderation- Limit alcohol intake to less than two drinks a day. Never drink and drive.  Dentist- Brush and floss twice daily; visit your dentist twice a year.  Depression- Your emotional health is as important as your physical health. If you're feeling down, or losing interest in things you would normally enjoy please talk to your healthcare provider.  Eye exam- Visit your eye doctor every year.  Safe sex- If you may be exposed to a sexually transmitted infection, use a condom.  Seat belts- Seat belts can save your life; always wear one.  Smoke/Carbon Monoxide detectors- These detectors need to be installed on the appropriate level of your home.  Replace batteries at least once a year.  Skin cancer- When out in the sun, cover up and use sunscreen 15 SPF or higher.  Violence- If anyone is threatening you, please tell your healthcare provider.  Living Will/ Health care power of attorney- Speak with your healthcare provider and family.  If you have lab work done today you will be contacted with your lab results within the next 2 weeks.  If you have not heard from [place] then  please contact us. The fastest way to get your results is to register for My Chart.   IF you received an x-ray today, you will receive an invoice from [facility]. Please contact [facility] at [phone] with questions or concerns regarding your invoice.   IF you received labwork today, you will receive an invoice from [facility]. Please contact [facility] at [phone] with questions or concerns regarding your invoice.   Our  billing staff will not be able to assist you with questions regarding bills from these companies.  You will be contacted with the lab results as soon as they are available. The fastest way to get your results is to activate your My Chart account. Instructions are located on the last page of this paperwork. If you have not heard from [place] regarding the results in 2 weeks, please contact this office.

## 2020-10-18 NOTE — Progress Notes (Signed)
Subjective:  Patient ID: Jonathan Bush, male    DOB: 01-Mar-1961  Age: 60 y.o. MRN: [id number]  CC:  Chief Complaint  Patient presents with  . Annual Exam    Patient states he is here for an annual physical.    HPI Jonathan Bush presents for  Annual physical exam  Hypertension: Stable on recheck BP in November.  Continued on lisinopril HCTZ 40/25 mg daily, amlodipine 10 mg daily. Home readings: none.  No new side effects.  Started exercise last week - walking 2 miles per day. No CP/dyspnea.   BP Readings from Last 3 Encounters:  10/18/20 (!) 156/82  05/31/20 (!) 158/82  05/31/20 (!) 145/81   Lab Results  Component Value Date   CREATININE 0.94 04/17/2020   Prediabetes: Diet/exercise has been discussed previously. Recent increased exercise and working on decreased ice cream, tea.  Lab Results  Component Value Date   HGBA1C 5.9 (H) 04/17/2020   Wt Readings from Last 3 Encounters:  10/18/20 228 lb 6.4 oz (103.6 kg)  05/31/20 227 lb (103 kg)  05/31/20 227 lb (103 kg)   Hyperlipidemia: Lipitor 10 mg daily. No new side effects/myalgias.  Lab Results  Component Value Date   CHOL 152 04/17/2020   HDL 31 (L) 04/17/2020   LDLCALC 89 04/17/2020   TRIG 184 (H) 04/17/2020   CHOLHDL 4.9 04/17/2020   Lab Results  Component Value Date   ALT 11 04/17/2020   AST 18 04/17/2020   ALKPHOS 73 04/17/2020   BILITOT <0.2 04/17/2020   Nicotine addiction: 1 pack/day smoker, we have discussed cessation previously.  No current plans to stop. Low-dose lung cancer screening discussed but he deferred at his last visit in November. Declined lung CA screening.   Cancer screening Colon cancer screening, Cologuard ordered previously, has not yet been performed. Has not been abe to find prior test kit.   Lab Results  Component Value Date   PSA1 0.5 12/30/2017  The natural history of prostate cancer and ongoing controversy regarding screening and potential treatment outcomes of prostate  cancer has been discussed with the patient. The meaning of a false positive PSA and a false negative PSA has been discussed. He indicates understanding of the limitations of this screening test and wishes  to proceed with screening PSA testing only.   Immunization History  Administered Date(s) Administered  . Moderna Sars-Covid-2 Vaccination 08/31/2019, 09/28/2019, 05/30/2020  . Tdap 12/30/2017  shingrix - declines. Pneumovax - declines.   Depression screen [facility] 2/9 10/18/2020 05/31/2020 05/31/2020 04/17/2020 09/22/2019  Decreased Interest 0 0 0 0 0  Down, Depressed, Hopeless 0 0 0 0 0  PHQ - 2 Score 0 0 0 0 0     Hearing Screening   '125Hz'  '250Hz'  '500Hz'  '1000Hz'  '2000Hz'  '3000Hz'  '4000Hz'  '6000Hz'  '8000Hz'   Right ear:           Left ear:             Visual Acuity Screening   Right eye Left eye Both eyes  Without correction: '20/15 20/20 20/15 '  With correction:     [employer]. Due for exam.   Dental: every 6 months.   Exercise: walking 2 mi per day.   Alcohol: 1 day per week- 8-10 at that time.  Lab Results  Component Value Date   ALT 11 04/17/2020   AST 18 04/17/2020   ALKPHOS 73 04/17/2020   BILITOT <0.2 04/17/2020      History Patient Active Problem List  Diagnosis Date Noted  . Lumbar pain 01/28/2018  . Sciatica of left side 01/28/2018  . Musculoskeletal pain 01/28/2018  . Muscle spasm 01/28/2018  . Essential hypertension 05/02/2017  . Hyperlipidemia 05/02/2017  . Prediabetes 05/02/2017   Past Medical History:  Diagnosis Date  . Hypertension    Past Surgical History:  Procedure Laterality Date  . VASECTOMY     No Known Allergies Prior to Admission medications   Medication Sig Start Date End Date Taking? Authorizing Provider  amLODipine (NORVASC) 10 MG tablet Take 1 tablet (10 mg total) by mouth daily. 06/21/20  Yes Wendie Agreste, MD  atorvastatin (LIPITOR) 10 MG tablet Take 1 tablet (10 mg total) by mouth daily. 04/17/20  Yes Wendie Agreste, MD  fish oil-omega-3  fatty acids 1000 MG capsule Take 2 g by mouth daily.   Yes [provider]  glucosamine-chondroitin 500-400 MG tablet Take 1 tablet by mouth 3 (three) times daily.   Yes [provider]  lisinopril-hydrochlorothiazide (ZESTORETIC) 20-12.5 MG tablet Take 2 tablets by mouth daily. 04/17/20  Yes Wendie Agreste, MD  Multiple Vitamin (MULTIVITAMIN) tablet Take 1 tablet by mouth daily.   Yes [provider]  erythromycin ophthalmic ointment Place 1 application into the left eye 3 (three) times daily. For 1 week. Patient not taking: Reported on 10/18/2020 05/31/20   Wendie Agreste, MD   Social History   Socioeconomic History  . Marital status: Unknown    Spouse name: Not on file  . Number of children: Not on file  . Years of education: Not on file  . Highest education level: Not on file  Occupational History  . Occupation: Scientist, clinical (histocompatibility and immunogenetics): [employer]  Tobacco Use  . Smoking status: Current Every Day Smoker    Packs/day: 1.00    Years: 30.00    Pack years: 30.00    Types: Cigarettes  . Smokeless tobacco: Never Used  Substance and Sexual Activity  . Alcohol use: Yes    Alcohol/week: 12.0 standard drinks    Types: 12 Cans of beer per week    Comment: beer  . Drug use: No  . Sexual activity: Yes    Partners: Female  Other Topics Concern  . Not on file  Social History Narrative   Patient walks for exercise.   Social Determinants of Health   Financial Resource Strain: Not on file  Food Insecurity: Not on file  Transportation Needs: Not on file  Physical Activity: Not on file  Stress: Not on file  Social Connections: Not on file  Intimate Partner Violence: Not on file    Review of Systems   Objective:   Vitals:   10/18/20 0803 10/18/20 0839  BP: (!) 143/68 (!) 156/82  Pulse: 70   Resp: 18   Temp: 98 F (36.7 C)   TempSrc: Temporal   SpO2: 99%   Weight: 228 lb 6.4 oz (103.6 kg)   Height: 6' (1.829 m)      Physical Exam Vitals  reviewed.  Constitutional:      Appearance: He is well-developed.  HENT:     Head: Normocephalic and atraumatic.     Right Ear: External ear normal.     Left Ear: External ear normal.  Eyes:     Conjunctiva/sclera: Conjunctivae normal.     Pupils: Pupils are equal, round, and reactive to light.  Neck:     Thyroid: No thyromegaly.  Cardiovascular:     Rate and Rhythm: Normal rate  and regular rhythm.     Heart sounds: Normal heart sounds.  Pulmonary:     Effort: Pulmonary effort is normal. No respiratory distress.     Breath sounds: Normal breath sounds. No wheezing.  Abdominal:     General: There is no distension.     Palpations: Abdomen is soft.     Tenderness: There is no abdominal tenderness.  Musculoskeletal:        General: No tenderness. Normal range of motion.     Cervical back: Normal range of motion and neck supple.  Lymphadenopathy:     Cervical: No cervical adenopathy.  Skin:    General: Skin is warm and dry.  Neurological:     Mental Status: He is alert and oriented to person, place, and time.     Deep Tendon Reflexes: Reflexes are normal and symmetric.  Psychiatric:        Behavior: Behavior normal.        Assessment & Plan:  Jonathan Bush is a 60 y.o. male . Annual physical exam  - -anticipatory guidance as below in AVS, screening labs above. Health maintenance items as above in HPI discussed/recommended as applicable.   Essential hypertension - Plan: CBC with Differential/Platelet, TSH  - decreased control, but exercising, diet changes. Check home readings as likely component of whitecoat hypertension but also discussed decrease alcohol, RTC precautions if home readings over 130/80  Prediabetes - Plan: Comprehensive metabolic panel, Hemoglobin A1c Obesity (BMI 30-39.9)  -Commended on dietary changes and new exercise, check A1c  Hyperlipidemia, unspecified hyperlipidemia type - Plan: Comprehensive metabolic panel, Lipid panel  -Tolerating current  regimen, continue same  Special screening for malignant neoplasms, colon - Plan: Cologuard  -New kit ordered  Screening for prostate cancer - Plan: PSA  Screening, anemia, deficiency, iron - Plan: CBC with Differential/Platelet  Screening for thyroid disorder - Plan: TSH   No orders of the defined types were placed in this encounter.  Patient Instructions    No change in meds today, but monitor home blood pressures and if running over 130/80 let me know so we can adjust meds.  Keep up the good work with exercise and watching diet. Try to cut back on some of the alcoholic drinks. Follow up in 6 months, but let me know if there are questions sooner.   Keeping you healthy  Get these tests  Blood pressure- Have your blood pressure checked once a year by your healthcare provider.  Normal blood pressure is 120/80  Weight- Have your body mass index (BMI) calculated to screen for obesity.  BMI is a measure of body fat based on height and weight. You can also calculate your own BMI at ViewBanking.si.  Cholesterol- Have your cholesterol checked every year.  Diabetes- Have your blood sugar checked regularly if you have high blood pressure, high cholesterol, have a family history of diabetes or if you are overweight.  Screening for Colon Cancer- Colonoscopy starting at age 2.  Screening may begin sooner depending on your family history and other health conditions. Follow up colonoscopy as directed by your Gastroenterologist.  Screening for Prostate Cancer- Both blood work (PSA) and a rectal exam help screen for Prostate Cancer.  Screening begins at age 47 with African-American men and at age 46 with Caucasian men.  Screening may begin sooner depending on your family history.   Take these medicines  Flu shot- Every fall.  Tetanus- Every 10 years.  Shingrix- twice after the age of 42 to prevent  Shingles.  Pneumonia shot- Once after the age of 77; if you are younger than 60,  ask your healthcare provider if you need a Pneumonia shot.  Take these steps  Don't smoke- If you do smoke, talk to your doctor about quitting.  For tips on how to quit, go to www.smokefree.gov or call 1-800-QUIT-NOW.  Be physically active- Exercise 5 days a week for at least 30 minutes.  If you are not already physically active start slow and gradually work up to 30 minutes of moderate physical activity.  Examples of moderate activity include walking briskly, mowing the yard, dancing, swimming, bicycling, etc.  Eat a healthy diet- Eat a variety of healthy food such as fruits, vegetables, low fat milk, low fat cheese, yogurt, lean meant, poultry, fish, beans, tofu, etc. For more information go to www.thenutritionsource.org  Drink alcohol in moderation- Limit alcohol intake to less than two drinks a day. Never drink and drive.  Dentist- Brush and floss twice daily; visit your dentist twice a year.  Depression- Your emotional health is as important as your physical health. If you're feeling down, or losing interest in things you would normally enjoy please talk to your healthcare provider.  Eye exam- Visit your eye doctor every year.  Safe sex- If you may be exposed to a sexually transmitted infection, use a condom.  Seat belts- Seat belts can save your life; always wear one.  Smoke/Carbon Monoxide detectors- These detectors need to be installed on the appropriate level of your home.  Replace batteries at least once a year.  Skin cancer- When out in the sun, cover up and use sunscreen 15 SPF or higher.  Violence- If anyone is threatening you, please tell your healthcare provider.  Living Will/ Health care power of attorney- Speak with your healthcare provider and family.  If you have lab work done today you will be contacted with your lab results within the next 2 weeks.  If you have not heard from [place] then please contact us. The fastest way to get your results is to register for My  Chart.   IF you received an x-ray today, you will receive an invoice from [facility]. Please contact [facility] at [phone] with questions or concerns regarding your invoice.   IF you received labwork today, you will receive an invoice from [facility]. Please contact [facility] at [phone] with questions or concerns regarding your invoice.   Our billing staff will not be able to assist you with questions regarding bills from these companies.  You will be contacted with the lab results as soon as they are available. The fastest way to get your results is to activate your My Chart account. Instructions are located on the last page of this paperwork. If you have not heard from [place] regarding the results in 2 weeks, please contact this office.          Signed, Merri Ray, MD [facility]

## 2021-04-19 ENCOUNTER — Ambulatory Visit: Payer: BC Managed Care – PPO | Admitting: Family Medicine

## 2021-10-19 ENCOUNTER — Ambulatory Visit (INDEPENDENT_AMBULATORY_CARE_PROVIDER_SITE_OTHER): Payer: No Typology Code available for payment source | Admitting: Family Medicine

## 2021-10-19 ENCOUNTER — Other Ambulatory Visit: Payer: Self-pay

## 2021-10-19 ENCOUNTER — Encounter: Payer: Self-pay | Admitting: Family Medicine

## 2021-10-19 VITALS — BP 160/74 | HR 70 | Temp 98.4°F | Resp 17 | Ht 72.0 in | Wt 229.0 lb

## 2021-10-19 DIAGNOSIS — Z0000 Encounter for general adult medical examination without abnormal findings: Secondary | ICD-10-CM

## 2021-10-19 DIAGNOSIS — E785 Hyperlipidemia, unspecified: Secondary | ICD-10-CM | POA: Diagnosis not present

## 2021-10-19 DIAGNOSIS — F17210 Nicotine dependence, cigarettes, uncomplicated: Secondary | ICD-10-CM | POA: Diagnosis not present

## 2021-10-19 DIAGNOSIS — I10 Essential (primary) hypertension: Secondary | ICD-10-CM | POA: Diagnosis not present

## 2021-10-19 DIAGNOSIS — R7303 Prediabetes: Secondary | ICD-10-CM

## 2021-10-19 DIAGNOSIS — Z125 Encounter for screening for malignant neoplasm of prostate: Secondary | ICD-10-CM | POA: Diagnosis not present

## 2021-10-19 DIAGNOSIS — Z1211 Encounter for screening for malignant neoplasm of colon: Secondary | ICD-10-CM

## 2021-10-19 LAB — HEMOGLOBIN A1C: Hgb A1c MFr Bld: 5.9 % (ref 4.6–6.5)

## 2021-10-19 LAB — PSA: PSA: 0.43 ng/mL (ref 0.10–4.00)

## 2021-10-19 LAB — LIPID PANEL
Cholesterol: 140 mg/dL (ref 0–200)
HDL: 30.4 mg/dL — ABNORMAL LOW (ref >39.00)
LDL Cholesterol: 78 mg/dL (ref 0–99)
NonHDL: 109.97
Total CHOL/HDL Ratio: 5
Triglycerides: 158 mg/dL — ABNORMAL HIGH (ref 0.0–149.0)
VLDL: 31.6 mg/dL (ref 0.0–40.0)

## 2021-10-19 LAB — COMPREHENSIVE METABOLIC PANEL
ALT: 15 U/L (ref 0–53)
AST: 20 U/L (ref 0–37)
Albumin: 4.7 g/dL (ref 3.5–5.2)
Alkaline Phosphatase: 67 U/L (ref 39–117)
BUN: 13 mg/dL (ref 6–23)
CO2: 27 mEq/L (ref 19–32)
Calcium: 9.9 mg/dL (ref 8.4–10.5)
Chloride: 100 mEq/L (ref 96–112)
Creatinine, Ser: 0.86 mg/dL (ref 0.40–1.50)
GFR: 94.96 mL/min (ref >60.00)
Glucose, Bld: 93 mg/dL (ref 70–99)
Potassium: 4 mEq/L (ref 3.5–5.1)
Sodium: 135 mEq/L (ref 135–145)
Total Bilirubin: 0.6 mg/dL (ref 0.2–1.2)
Total Protein: 7.7 g/dL (ref 6.0–8.3)

## 2021-10-19 MED ORDER — ATORVASTATIN CALCIUM 10 MG PO TABS: 10.0000 mg | ORAL_TABLET | Freq: Every day | ORAL | 2 refills | Status: DC

## 2021-10-19 MED ORDER — LISINOPRIL-HYDROCHLOROTHIAZIDE 20-12.5 MG PO TABS: 2.0000 | ORAL_TABLET | Freq: Every day | ORAL | 2 refills | Status: DC

## 2021-10-19 MED ORDER — AMLODIPINE BESYLATE 10 MG PO TABS: 10.0000 mg | ORAL_TABLET | Freq: Every day | ORAL | 2 refills | Status: DC

## 2021-10-19 MED ORDER — METOPROLOL SUCCINATE ER 25 MG PO TB24: 25.0000 mg | ORAL_TABLET | Freq: Every day | ORAL | 3 refills | Status: DC

## 2021-10-19 NOTE — Patient Instructions (Addendum)
I do recommend meeting with eye doctor.  ?Cut back on beers - goal of no more than 1-2 per day.  ?Add new blood pressure med, recheck 6 weeks.  ?Return to the clinic or go to the nearest emergency room if any of your symptoms worsen or new symptoms occur. ?Thanks for coming in today.  ? ?Managing Your Hypertension ?Hypertension, also called high blood pressure, is when the force of the blood pressing against the walls of the arteries is too strong. Arteries are blood vessels that carry blood from your heart throughout your body. Hypertension forces the heart to work harder to pump blood and may cause the arteries to become narrow or stiff. ?Understanding blood pressure readings ?A blood pressure reading includes a higher number over a lower number: ?The first, or top, number is called the systolic pressure. It is a measure of the pressure in your arteries as your heart beats. ?The second, or bottom number, is called the diastolic pressure. It is a measure of the pressure in your arteries as the heart relaxes. ?For most people, a normal blood pressure is below 120/80. Your personal target blood pressure may vary depending on your medical conditions, your age, and other factors. ?Blood pressure is classified into four stages. Based on your blood pressure reading, your health care provider may use the following stages to determine what type of treatment you need, if any. Systolic pressure and diastolic pressure are measured in a unit called millimeters of mercury (mmHg). ?Normal ?Systolic pressure: below 120. ?Diastolic pressure: below 80. ?Elevated ?Systolic pressure: 120-129. ?Diastolic pressure: below 80. ?Hypertension stage 1 ?Systolic pressure: 130-139. ?Diastolic pressure: 80-89. ?Hypertension stage 2 ?Systolic pressure: 140 or above. ?Diastolic pressure: 90 or above. ?How can this condition affect me? ?Managing your hypertension is very important. Over time, hypertension can damage the arteries and decrease  blood flow to parts of the body, including the brain, heart, and kidneys. Having untreated or uncontrolled hypertension can lead to: ?A heart attack. ?A stroke. ?A weakened blood vessel (aneurysm). ?Heart failure. ?Kidney damage. ?Eye damage. ?Memory and concentration problems. ?Vascular dementia. ?What actions can I take to manage this condition? ?Hypertension can be managed by making lifestyle changes and possibly by taking medicines. Your health care provider will help you make a plan to bring your blood pressure within a normal range. You may be referred for counseling on a healthy diet and physical activity. ?Nutrition ? ?Eat a diet that is high in fiber and potassium, and low in salt (sodium), added sugar, and fat. An example eating plan is called the DASH diet. DASH stands for Dietary Approaches to Stop Hypertension. To eat this way: ?Eat plenty of fresh fruits and vegetables. Try to fill one-half of your plate at each meal with fruits and vegetables. ?Eat whole grains, such as whole-wheat pasta, brown rice, or whole-grain bread. Fill about one-fourth of your plate with whole grains. ?Eat low-fat dairy products. ?Avoid fatty cuts of meat, processed or cured meats, and poultry with skin. Fill about one-fourth of your plate with lean proteins such as fish, chicken without skin, beans, eggs, and tofu. ?Avoid pre-made and processed foods. These tend to be higher in sodium, added sugar, and fat. ?Reduce your daily sodium intake. Many people with hypertension should eat less than 1,500 mg of sodium a day. ?Lifestyle ? ?Work with your health care provider to maintain a healthy body weight or to lose weight. Ask what an ideal weight is for you. ?Get at least 30 minutes  of exercise that causes your heart to beat faster (aerobic exercise) most days of the week. Activities may include walking, swimming, or biking. ?Include exercise to strengthen your muscles (resistance exercise), such as weight lifting, as part of your  weekly exercise routine. Try to do these types of exercises for 30 minutes at least 3 days a week. ?Do not use any products that contain nicotine or tobacco. These products include cigarettes, chewing tobacco, and vaping devices, such as e-cigarettes. If you need help quitting, ask your health care provider. ?Control any long-term (chronic) conditions you have, such as high cholesterol or diabetes. ?Identify your sources of stress and find ways to manage stress. This may include meditation, deep breathing, or making time for fun activities. ?Alcohol use ?Do not drink alcohol if: ?Your health care provider tells you not to drink. ?You are pregnant, may be pregnant, or are planning to become pregnant. ?If you drink alcohol: ?Limit how much you have to: ?0-1 drink a day for women. ?0-2 drinks a day for men. ?Know how much alcohol is in your drink. In the [place] one drink equals one 12 oz bottle of beer (355 mL), one 5 oz glass of wine (148 mL), or one 1? oz glass of hard liquor (44 mL). ?Medicines ?Your health care provider may prescribe medicine if lifestyle changes are not enough to get your blood pressure under control and if: ?Your systolic blood pressure is 130 or higher. ?Your diastolic blood pressure is 80 or higher. ?Take medicines only as told by your health care provider. Follow the directions carefully. Blood pressure medicines must be taken as told by your health care provider. The medicine does not work as well when you skip doses. Skipping doses also puts you at risk for problems. ?Monitoring ?Before you monitor your blood pressure: ?Do not smoke, drink caffeinated beverages, or exercise within 30 minutes before taking a measurement. ?Use the bathroom and empty your bladder (urinate). ?Sit quietly for at least 5 minutes before taking measurements. ?Monitor your blood pressure at home as told by your health care provider. To do this: ?Sit with your back straight and supported. ?Place your feet flat on the  floor. Do not cross your legs. ?Support your arm on a flat surface, such as a table. Make sure your upper arm is at heart level. ?Each time you measure, take two or three readings one minute apart and record the results. ?You may also need to have your blood pressure checked regularly by your health care provider. ?General information ?Talk with your health care provider about your diet, exercise habits, and other lifestyle factors that may be contributing to hypertension. ?Review all the medicines you take with your health care provider because there may be side effects or interactions. ?Keep all follow-up visits. Your health care provider can help you create and adjust your plan for managing your high blood pressure. ?Where to find more information ?National Heart, Lung, and Blood Institute: PopSteam.is ?American Heart Association: www.heart.org ?Contact a health care provider if: ?You think you are having a reaction to medicines you have taken. ?You have repeated (recurrent) headaches. ?You feel dizzy. ?You have swelling in your ankles. ?You have trouble with your vision. ?Get help right away if: ?You develop a severe headache or confusion. ?You have unusual weakness or numbness, or you feel faint. ?You have severe pain in your chest or abdomen. ?You vomit repeatedly. ?You have trouble breathing. ?These symptoms may be an emergency. Get help right away. Call 911. ?Do  not wait to see if the symptoms will go away. ?Do not drive yourself to the hospital. ?Summary ?Hypertension is when the force of blood pumping through your arteries is too strong. If this condition is not controlled, it may put you at risk for serious complications. ?Your personal target blood pressure may vary depending on your medical conditions, your age, and other factors. For most people, a normal blood pressure is less than 120/80. ?Hypertension is managed by lifestyle changes, medicines, or both. ?Lifestyle changes to help manage  hypertension include losing weight, eating a healthy, low-sodium diet, exercising more, stopping smoking, and limiting alcohol. ?This information is not intended to replace advice given to you by your health ca

## 2021-10-19 NOTE — Progress Notes (Signed)
? ?Subjective:  ?Patient ID: Jonathan Bush, male    DOB: [birth year]  Age: 61 y.o. MRN: [id number] ? ?CC:  ?Chief Complaint  ?Patient presents with  ? Annual Exam  ?  Patient states he is here for a CPE and medication refill.  ? ? ?HPI ?Jonathan Bush presents for Annual Exam ?medication refill.  ? ?Hypertension: ?Uncontrolled.  Home monitoring discussed at his last visit Oct 18, 2020 with 36-month follow-up recommended.  RTC precautions if elevated home readings at that time.   ?Currently taking amlodipine 10 mg daily, lisinopril HCTZ 20/12.5 mg 2 tablets/day. ?Home readings:138/75, 140's at times.  ?No new med side effects.  ?Still playing golf. No CP/dyspnea.  ?BP Readings from Last 3 Encounters:  ?10/19/21 (!) 160/74  ?10/18/20 (!) 156/82  ?05/31/20 (!) 158/82  ? ?Lab Results  ?Component Value Date  ? CREATININE 0.87 10/18/2020  ? ?Prediabetes: ?Diet/exercise discussed previously, golf, starting to walk for exercise and work in yard.  ?Cut back on sweet tea.  ?Lab Results  ?Component Value Date  ? HGBA1C 6.1 10/18/2020  ? ?Wt Readings from Last 3 Encounters:  ?10/19/21 229 lb (103.9 kg)  ?10/18/20 228 lb 6.4 oz (103.6 kg)  ?05/31/20 227 lb (103 kg)  ? ?Nicotine addiction ?1 pack/day smoker, cessation has been discussed multiple times previously.  No plan to stop smoking as last physical.  Lung cancer screening has also been recommended with low-dose CT but declined, and declines today. Not ready to quit. ? ?Hyperlipidemia: ?Lipitor 10 mg daily, no new myalgias. Tolerating current statin.  ?Lab Results  ?Component Value Date  ? CHOL 141 10/18/2020  ? HDL 28.50 (L) 10/18/2020  ? LDLCALC 75 10/18/2020  ? TRIG 187.0 (H) 10/18/2020  ? CHOLHDL 5 10/18/2020  ? ?Lab Results  ?Component Value Date  ? ALT 13 10/18/2020  ? AST 18 10/18/2020  ? ALKPHOS 69 10/18/2020  ? BILITOT 0.4 10/18/2020  ? ? ? ?  10/19/2021  ?  9:26 AM 10/18/2020  ?  8:04 AM 05/31/2020  ?  9:33 AM 05/31/2020  ?  9:21 AM 04/17/2020  ? 10:14 AM  ?Depression  screen PHQ 2/9  ?Decreased Interest 0 0 0 0 0  ?Down, Depressed, Hopeless 0 0 0 0 0  ?PHQ - 2 Score 0 0 0 0 0  ?Altered sleeping 0      ?Tired, decreased energy 0      ?Change in appetite 0      ?Feeling bad or failure about yourself  0      ?Trouble concentrating 0      ?Moving slowly or fidgety/restless 0      ?Suicidal thoughts 0      ?PHQ-9 Score 0      ?Difficult doing work/chores Not difficult at all      ? ? ?Health Maintenance  ?Topic Date Due  ? COLONOSCOPY (Pts 45-37yrs Insurance coverage will need to be confirmed)  Never done  ? COVID-19 Vaccine (4 - Booster for Moderna series) 11/04/2021 (Originally 07/25/2020)  ? Zoster Vaccines- Shingrix (1 of 2) 01/19/2022 (Originally 07/27/2012)  ? TETANUS/TDAP  12/31/2027  ? Hepatitis C Screening  Completed  ? HIV Screening  Completed  ? HPV VACCINES  Aged Out  ? INFLUENZA VACCINE  Discontinued  ?Colon: Cologuard ordered prior - did not receive.  ?Prostate: does NOT have family history of prostate cancer ?The natural history of prostate cancer and ongoing controversy regarding screening and potential treatment outcomes of prostate cancer  has been discussed with the patient. The meaning of a false positive PSA and a false negative PSA has been discussed. He indicates understanding of the limitations of this screening test and wishes  to proceed with screening PSA testing. ?Lab Results  ?Component Value Date  ? PSA1 0.5 12/30/2017  ? PSA 0.50 10/18/2020  ? ? ?Immunization History  ?Administered Date(s) Administered  ? Moderna Sars-Covid-2 Vaccination 08/31/2019, 09/28/2019, 05/30/2020  ? Tdap 12/30/2017  ?Shingles vaccine declined. ?Bivalent booster recommended. No recent infection.  ? ?No results found. ?No glasses/contacts. Driving glasses only.  ? ?Dental:Yes and Within Last 6 months ? ?Alcohol: 1 day per week - up to 8 beers.  ? ?Tobacco: as above. 1ppd.  ? ?Exercise:as above - trying to increase. No CP/dyspnea with exercise. No limitations in exercise.   ? ? ?History ?Patient Active Problem List  ? Diagnosis Date Noted  ? Lumbar pain 01/28/2018  ? Sciatica of left side 01/28/2018  ? Musculoskeletal pain 01/28/2018  ? Muscle spasm 01/28/2018  ? Essential hypertension 05/02/2017  ? Hyperlipidemia 05/02/2017  ? Prediabetes 05/02/2017  ? ?Past Medical History:  ?Diagnosis Date  ? Hypertension   ? ?Past Surgical History:  ?Procedure Laterality Date  ? VASECTOMY    ? ?No Known Allergies ?Prior to Admission medications   ?Medication Sig Start Date End Date Taking? Authorizing Provider  ?amLODipine (NORVASC) 10 MG tablet Take 1 tablet (10 mg total) by mouth daily. 10/18/20  Yes Shade Flood, MD  ?atorvastatin (LIPITOR) 10 MG tablet Take 1 tablet (10 mg total) by mouth daily. 10/18/20  Yes Shade Flood, MD  ?fish oil-omega-3 fatty acids 1000 MG capsule Take 2 g by mouth daily.   Yes [provider]  ?glucosamine-chondroitin 500-400 MG tablet Take 1 tablet by mouth 3 (three) times daily.   Yes [provider]  ?lisinopril-hydrochlorothiazide (ZESTORETIC) 20-12.5 MG tablet Take 2 tablets by mouth daily. 10/18/20  Yes Shade Flood, MD  ?Multiple Vitamin (MULTIVITAMIN) tablet Take 1 tablet by mouth daily.   Yes [provider]  ? ?Social History  ? ?Socioeconomic History  ? Marital status: Unknown  ?  Spouse name: Not on file  ? Number of children: Not on file  ? Years of education: Not on file  ? Highest education level: Not on file  ?Occupational History  ? Occupation: Sales  ?  Employer: [employer]  ?Tobacco Use  ? Smoking status: Every Day  ?  Packs/day: 1.00  ?  Years: 30.00  ?  Pack years: 30.00  ?  Types: Cigarettes  ? Smokeless tobacco: Never  ?Substance and Sexual Activity  ? Alcohol use: Yes  ?  Alcohol/week: 12.0 standard drinks  ?  Types: 12 Cans of beer per week  ?  Comment: beer  ? Drug use: No  ? Sexual activity: Yes  ?  Partners: Female  ?Other Topics Concern  ? Not on file  ?Social History Narrative  ? Patient walks for  exercise.  ? ?Social Determinants of Health  ? ?Financial Resource Strain: Not on file  ?Food Insecurity: Not on file  ?Transportation Needs: Not on file  ?Physical Activity: Not on file  ?Stress: Not on file  ?Social Connections: Not on file  ?Intimate Partner Violence: Not on file  ? ? ?Review of Systems ?13 point review of systems per patient health survey noted.  Negative other than as indicated above or in HPI.  ? ? ?Objective:  ? ?Vitals:  ? 10/19/21 0924  ?  BP: (!) 160/74  ?Pulse: 70  ?Resp: 17  ?Temp: 98.4 ?F (36.9 ?C)  ?TempSrc: Temporal  ?SpO2: 94%  ?Weight: 229 lb (103.9 kg)  ?Height: 6' (1.829 m)  ? ? ? ?Physical Exam ?Vitals reviewed.  ?Constitutional:   ?   Appearance: He is well-developed.  ?HENT:  ?   Head: Normocephalic and atraumatic.  ?   Right Ear: External ear normal.  ?   Left Ear: External ear normal.  ?   Ears:  ?   Comments: Excess cerumen bilaterally, nonobstructive. ?Eyes:  ?   Conjunctiva/sclera: Conjunctivae normal.  ?   Pupils: Pupils are equal, round, and reactive to light.  ?Neck:  ?   Thyroid: No thyromegaly.  ?   Vascular: No carotid bruit or JVD.  ?Cardiovascular:  ?   Rate and Rhythm: Normal rate and regular rhythm.  ?   Heart sounds: Normal heart sounds. No murmur heard. ?Pulmonary:  ?   Effort: Pulmonary effort is normal. No respiratory distress.  ?   Breath sounds: Normal breath sounds. No wheezing or rales.  ?Abdominal:  ?   General: There is no distension.  ?   Palpations: Abdomen is soft.  ?   Tenderness: There is no abdominal tenderness.  ?Musculoskeletal:     ?   General: No tenderness. Normal range of motion.  ?   Cervical back: Normal range of motion and neck supple.  ?   Right lower leg: No edema.  ?   Left lower leg: No edema.  ?Lymphadenopathy:  ?   Cervical: No cervical adenopathy.  ?Skin: ?   General: Skin is warm and dry.  ?Neurological:  ?   Mental Status: He is alert and oriented to person, place, and time.  ?   Deep Tendon Reflexes: Reflexes are normal and  symmetric.  ?Psychiatric:     ?   Mood and Affect: Mood normal.     ?   Behavior: Behavior normal.  ? ? ? ? ? ?Assessment & Plan:  ?Jonathan AbbeMichael D Bush is a 61 y.o. male . ?Annual physical exam ? - -anticipatory guidance as below in

## 2021-10-22 ENCOUNTER — Telehealth: Payer: Self-pay | Admitting: Family Medicine

## 2021-10-22 NOTE — Telephone Encounter (Signed)
I have placed a phy form in the bin upfront with a charge sheet.  ?

## 2021-10-23 NOTE — Telephone Encounter (Signed)
Form up front FYI ?

## 2021-10-23 NOTE — Telephone Encounter (Signed)
Placed in your to be signed, no other info needed signature only  ?

## 2021-10-24 NOTE — Telephone Encounter (Signed)
done

## 2021-11-07 NOTE — Telephone Encounter (Signed)
PT is calling in stating that his insurance company has not received these forms. [place] is asking for call back with an update.

## 2021-11-08 ENCOUNTER — Telehealth: Payer: Self-pay

## 2021-11-08 NOTE — Telephone Encounter (Signed)
Patient's new form is in the bin upfront. Patient would need for insurance before the end of the month or it will increase

## 2021-11-08 NOTE — Telephone Encounter (Signed)
This is from 2 weeks ago this is likely already been sent to scan I do not have this in my current faxes. Will have to call and request new forms.

## 2021-11-08 NOTE — Telephone Encounter (Signed)
Placed in your to be signed folder for a new signature

## 2021-11-08 NOTE — Telephone Encounter (Signed)
Error

## 2021-11-08 NOTE — Telephone Encounter (Signed)
Do you have these forms by any chance I would be happy to fax.

## 2021-11-08 NOTE — Telephone Encounter (Signed)
Jonathan Bush is calling back in about paperwork. Advised message below, patient emailed me the form. Have placed in providers bin. Jonathan Bush would like a call once it has been finished as he will come pick them up. Is needing these forms completed before the end of the month or his insurance will go up.

## 2021-11-08 NOTE — Telephone Encounter (Signed)
Done

## 2021-11-30 ENCOUNTER — Ambulatory Visit: Payer: No Typology Code available for payment source | Admitting: Family Medicine

## 2021-11-30 VITALS — BP 142/76 | HR 76 | Temp 98.1°F | Resp 16 | Ht 72.0 in | Wt 226.4 lb

## 2021-11-30 DIAGNOSIS — I10 Essential (primary) hypertension: Secondary | ICD-10-CM

## 2021-11-30 MED ORDER — METOPROLOL SUCCINATE ER 50 MG PO TB24: 50.0000 mg | ORAL_TABLET | Freq: Every day | ORAL | 1 refills | Status: DC

## 2021-11-30 NOTE — Patient Instructions (Signed)
Try taking higher dose of metoprolol for better control. Keep a record of your blood pressures outside of the office and let me know those readings next few weeks. If any new lightheadedness, dizziness, or other new side effects, return to 25mg  dose. Take care.

## 2021-11-30 NOTE — Progress Notes (Signed)
Subjective:  Patient ID: Jonathan Bush, male    DOB: [birth year]  Age: 61 y.o. MRN: [id number]  CC:  Chief Complaint  Patient presents with   Hypertension    Pt here for 6 week recheck notes his monitor at home is dead currently but had no physical sxs     HPI Jonathan Bush presents for   Hypertension: Follow-up from May 5 visit.  Uncontrolled BP at that time.  Low-dose Toprol added as maximized other regimen of amlodipine, lisinopril, HCTZ. Home readings: none  - meter needs battery.  No new side effects. No sexual side effects.  Cut back on diet, more active, playing golf.  BP Readings from Last 3 Encounters:  11/30/21 (!) 142/76  10/19/21 (!) 160/74  10/18/20 (!) 156/82   Lab Results  Component Value Date   CREATININE 0.86 10/19/2021    History Patient Active Problem List   Diagnosis Date Noted   Lumbar pain 01/28/2018   Sciatica of left side 01/28/2018   Musculoskeletal pain 01/28/2018   Muscle spasm 01/28/2018   Essential hypertension 05/02/2017   Hyperlipidemia 05/02/2017   Prediabetes 05/02/2017   Past Medical History:  Diagnosis Date   Hypertension    Past Surgical History:  Procedure Laterality Date   VASECTOMY     No Known Allergies Prior to Admission medications   Medication Sig Start Date End Date Taking? Authorizing Provider  amLODipine (NORVASC) 10 MG tablet Take 1 tablet (10 mg total) by mouth daily. 10/19/21  Yes Shade Flood, MD  atorvastatin (LIPITOR) 10 MG tablet Take 1 tablet (10 mg total) by mouth daily. 10/19/21  Yes Shade Flood, MD  fish oil-omega-3 fatty acids 1000 MG capsule Take 2 g by mouth daily.   Yes [provider]  glucosamine-chondroitin 500-400 MG tablet Take 1 tablet by mouth 3 (three) times daily.   Yes [provider]  lisinopril-hydrochlorothiazide (ZESTORETIC) 20-12.5 MG tablet Take 2 tablets by mouth daily. 10/19/21  Yes Shade Flood, MD  metoprolol succinate (TOPROL-XL) 25 MG 24 hr tablet  Take 1 tablet (25 mg total) by mouth daily. 10/19/21  Yes Shade Flood, MD  Multiple Vitamin (MULTIVITAMIN) tablet Take 1 tablet by mouth daily.   Yes [provider]   Social History   Socioeconomic History   Marital status: Unknown    Spouse name: Not on file   Number of children: Not on file   Years of education: Not on file   Highest education level: Not on file  Occupational History   Occupation: Sales    Employer: [employer]  Tobacco Use   Smoking status: Every Day    Packs/day: 1.00    Years: 30.00    Total pack years: 30.00    Types: Cigarettes   Smokeless tobacco: Never  Substance and Sexual Activity   Alcohol use: Yes    Alcohol/week: 12.0 standard drinks of alcohol    Types: 12 Cans of beer per week    Comment: beer   Drug use: No   Sexual activity: Yes    Partners: Female  Other Topics Concern   Not on file  Social History Narrative   Patient walks for exercise.   Social Determinants of Health   Financial Resource Strain: Not on file  Food Insecurity: Not on file  Transportation Needs: Not on file  Physical Activity: Not on file  Stress: Not on file  Social Connections: Not on file  Intimate Partner Violence: Not on file  Review of Systems Per HPI   Objective:   Vitals:   11/30/21 0822 11/30/21 0826  BP: (!) 144/80 (!) 142/76  Pulse: 76   Resp: 16   Temp: 98.1 F (36.7 C)   TempSrc: Temporal   SpO2: 98%   Weight: 226 lb 6.4 oz (102.7 kg)   Height: 6' (1.829 m)      Physical Exam Vitals reviewed.  Constitutional:      Appearance: He is well-developed.  HENT:     Head: Normocephalic and atraumatic.  Neck:     Vascular: No carotid bruit or JVD.  Cardiovascular:     Rate and Rhythm: Normal rate and regular rhythm.     Heart sounds: Normal heart sounds. No murmur heard. Pulmonary:     Effort: Pulmonary effort is normal.     Breath sounds: Normal breath sounds. No rales.  Musculoskeletal:     Right lower leg: No edema.      Left lower leg: No edema.  Skin:    General: Skin is warm and dry.  Neurological:     Mental Status: He is alert and oriented to person, place, and time.  Psychiatric:        Mood and Affect: Mood normal.    Assessment & Plan:  Jonathan Bush is a 61 y.o. male . Essential hypertension - Plan: metoprolol succinate (TOPROL-XL) 50 MG 24 hr tablet  -improving control, but still too high. Increase toprol to 50mg  as tolerating, potential side effects and RTC precautions discussed. Recheck 5 months for repeat labs. No other med changes at this time.   Meds ordered this encounter  Medications   metoprolol succinate (TOPROL-XL) 50 MG 24 hr tablet    Sig: Take 1 tablet (50 mg total) by mouth daily. Take with or immediately following a meal.    Dispense:  90 tablet    Refill:  1   Patient Instructions  Try taking higher dose of metoprolol for better control. Keep a record of your blood pressures outside of the office and let me know those readings next few weeks. If any new lightheadedness, dizziness, or other new side effects, return to 25mg  dose. Take care.     Signed,   , MD [facility], [facility] 11/30/21 8:57 AM

## 2021-12-20 ENCOUNTER — Other Ambulatory Visit: Payer: Self-pay | Admitting: Family Medicine

## 2021-12-20 DIAGNOSIS — R195 Other fecal abnormalities: Secondary | ICD-10-CM

## 2021-12-20 LAB — COLOGUARD: COLOGUARD: POSITIVE — AB

## 2021-12-20 NOTE — Progress Notes (Signed)
Positive cologuard.refer to GI.

## 2022-03-21 ENCOUNTER — Telehealth: Payer: No Typology Code available for payment source | Admitting: Family Medicine

## 2022-05-03 ENCOUNTER — Encounter: Payer: Self-pay | Admitting: Family Medicine

## 2022-05-03 ENCOUNTER — Ambulatory Visit: Payer: No Typology Code available for payment source | Admitting: Family Medicine

## 2022-05-03 VITALS — BP 138/68 | HR 69 | Temp 98.9°F | Ht 72.0 in | Wt 224.2 lb

## 2022-05-03 DIAGNOSIS — E785 Hyperlipidemia, unspecified: Secondary | ICD-10-CM | POA: Diagnosis not present

## 2022-05-03 DIAGNOSIS — F17210 Nicotine dependence, cigarettes, uncomplicated: Secondary | ICD-10-CM | POA: Diagnosis not present

## 2022-05-03 DIAGNOSIS — I10 Essential (primary) hypertension: Secondary | ICD-10-CM

## 2022-05-03 LAB — LIPID PANEL
Cholesterol: 147 mg/dL (ref 0–200)
HDL: 29.1 mg/dL — ABNORMAL LOW (ref >39.00)
LDL Cholesterol: 90 mg/dL (ref 0–99)
NonHDL: 118.35
Total CHOL/HDL Ratio: 5
Triglycerides: 140 mg/dL (ref 0.0–149.0)
VLDL: 28 mg/dL (ref 0.0–40.0)

## 2022-05-03 LAB — COMPREHENSIVE METABOLIC PANEL
ALT: 13 U/L (ref 0–53)
AST: 17 U/L (ref 0–37)
Albumin: 4.7 g/dL (ref 3.5–5.2)
Alkaline Phosphatase: 75 U/L (ref 39–117)
BUN: 12 mg/dL (ref 6–23)
CO2: 31 mEq/L (ref 19–32)
Calcium: 9.6 mg/dL (ref 8.4–10.5)
Chloride: 99 mEq/L (ref 96–112)
Creatinine, Ser: 0.93 mg/dL (ref 0.40–1.50)
GFR: 89.72 mL/min (ref >60.00)
Glucose, Bld: 100 mg/dL — ABNORMAL HIGH (ref 70–99)
Potassium: 4.5 mEq/L (ref 3.5–5.1)
Sodium: 136 mEq/L (ref 135–145)
Total Bilirubin: 0.5 mg/dL (ref 0.2–1.2)
Total Protein: 7.6 g/dL (ref 6.0–8.3)

## 2022-05-03 MED ORDER — ATORVASTATIN CALCIUM 10 MG PO TABS: 10.0000 mg | ORAL_TABLET | Freq: Every day | ORAL | 2 refills | Status: DC

## 2022-05-03 MED ORDER — LISINOPRIL-HYDROCHLOROTHIAZIDE 20-12.5 MG PO TABS: 2.0000 | ORAL_TABLET | Freq: Every day | ORAL | 2 refills | Status: DC

## 2022-05-03 MED ORDER — AMLODIPINE BESYLATE 10 MG PO TABS: 10.0000 mg | ORAL_TABLET | Freq: Every day | ORAL | 2 refills | Status: DC

## 2022-05-03 MED ORDER — METOPROLOL SUCCINATE ER 50 MG PO TB24: 50.0000 mg | ORAL_TABLET | Freq: Every day | ORAL | 2 refills | Status: DC

## 2022-05-03 NOTE — Patient Instructions (Signed)
Managing Your Hypertension Hypertension, also called high blood pressure, is when the force of the blood pressing against the walls of the arteries is too strong. Arteries are blood vessels that carry blood from your heart throughout your body. Hypertension forces the heart to work harder to pump blood and may cause the arteries to become narrow or stiff. Understanding blood pressure readings A blood pressure reading includes a higher number over a lower number: The first, or top, number is called the systolic pressure. It is a measure of the pressure in your arteries as your heart beats. The second, or bottom number, is called the diastolic pressure. It is a measure of the pressure in your arteries as the heart relaxes. For most people, a normal blood pressure is below 120/80. Your personal target blood pressure may vary depending on your medical conditions, your age, and other factors. Blood pressure is classified into four stages. Based on your blood pressure reading, your health care provider may use the following stages to determine what type of treatment you need, if any. Systolic pressure and diastolic pressure are measured in a unit called millimeters of mercury (mmHg). Normal Systolic pressure: below 120. Diastolic pressure: below 80. Elevated Systolic pressure: 120-129. Diastolic pressure: below 80. Hypertension stage 1 Systolic pressure: 130-139. Diastolic pressure: 80-89. Hypertension stage 2 Systolic pressure: 140 or above. Diastolic pressure: 90 or above. How can this condition affect me? Managing your hypertension is very important. Over time, hypertension can damage the arteries and decrease blood flow to parts of the body, including the brain, heart, and kidneys. Having untreated or uncontrolled hypertension can lead to: A heart attack. A stroke. A weakened blood vessel (aneurysm). Heart failure. Kidney damage. Eye damage. Memory and concentration problems. Vascular  dementia. What actions can I take to manage this condition? Hypertension can be managed by making lifestyle changes and possibly by taking medicines. Your health care provider will help you make a plan to bring your blood pressure within a normal range. You may be referred for counseling on a healthy diet and physical activity. Nutrition  Eat a diet that is high in fiber and potassium, and low in salt (sodium), added sugar, and fat. An example eating plan is called the DASH diet. DASH stands for Dietary Approaches to Stop Hypertension. To eat this way: Eat plenty of fresh fruits and vegetables. Try to fill one-half of your plate at each meal with fruits and vegetables. Eat whole grains, such as whole-wheat pasta, brown rice, or whole-grain bread. Fill about one-fourth of your plate with whole grains. Eat low-fat dairy products. Avoid fatty cuts of meat, processed or cured meats, and poultry with skin. Fill about one-fourth of your plate with lean proteins such as fish, chicken without skin, beans, eggs, and tofu. Avoid pre-made and processed foods. These tend to be higher in sodium, added sugar, and fat. Reduce your daily sodium intake. Many people with hypertension should eat less than 1,500 mg of sodium a day. Lifestyle  Work with your health care provider to maintain a healthy body weight or to lose weight. Ask what an ideal weight is for you. Get at least 30 minutes of exercise that causes your heart to beat faster (aerobic exercise) most days of the week. Activities may include walking, swimming, or biking. Include exercise to strengthen your muscles (resistance exercise), such as weight lifting, as part of your weekly exercise routine. Try to do these types of exercises for 30 minutes at least 3 days a week. Do   not use any products that contain nicotine or tobacco. These products include cigarettes, chewing tobacco, and vaping devices, such as e-cigarettes. If you need help quitting, ask your  health care provider. Control any long-term (chronic) conditions you have, such as high cholesterol or diabetes. Identify your sources of stress and find ways to manage stress. This may include meditation, deep breathing, or making time for fun activities. Alcohol use Do not drink alcohol if: Your health care provider tells you not to drink. You are pregnant, may be pregnant, or are planning to become pregnant. If you drink alcohol: Limit how much you have to: 0-1 drink a day for women. 0-2 drinks a day for men. Know how much alcohol is in your drink. In the [place] one drink equals one 12 oz bottle of beer (355 mL), one 5 oz glass of wine (148 mL), or one 1 oz glass of hard liquor (44 mL). Medicines Your health care provider may prescribe medicine if lifestyle changes are not enough to get your blood pressure under control and if: Your systolic blood pressure is 130 or higher. Your diastolic blood pressure is 80 or higher. Take medicines only as told by your health care provider. Follow the directions carefully. Blood pressure medicines must be taken as told by your health care provider. The medicine does not work as well when you skip doses. Skipping doses also puts you at risk for problems. Monitoring Before you monitor your blood pressure: Do not smoke, drink caffeinated beverages, or exercise within 30 minutes before taking a measurement. Use the bathroom and empty your bladder (urinate). Sit quietly for at least 5 minutes before taking measurements. Monitor your blood pressure at home as told by your health care provider. To do this: Sit with your back straight and supported. Place your feet flat on the floor. Do not cross your legs. Support your arm on a flat surface, such as a table. Make sure your upper arm is at heart level. Each time you measure, take two or three readings one minute apart and record the results. You may also need to have your blood pressure checked regularly by  your health care provider. General information Talk with your health care provider about your diet, exercise habits, and other lifestyle factors that may be contributing to hypertension. Review all the medicines you take with your health care provider because there may be side effects or interactions. Keep all follow-up visits. Your health care provider can help you create and adjust your plan for managing your high blood pressure. Where to find more information National Heart, Lung, and Blood Institute: www.nhlbi.nih.gov American Heart Association: www.heart.org Contact a health care provider if: You think you are having a reaction to medicines you have taken. You have repeated (recurrent) headaches. You feel dizzy. You have swelling in your ankles. You have trouble with your vision. Get help right away if: You develop a severe headache or confusion. You have unusual weakness or numbness, or you feel faint. You have severe pain in your chest or abdomen. You vomit repeatedly. You have trouble breathing. These symptoms may be an emergency. Get help right away. Call 911. Do not wait to see if the symptoms will go away. Do not drive yourself to the hospital. Summary Hypertension is when the force of blood pumping through your arteries is too strong. If this condition is not controlled, it may put you at risk for serious complications. Your personal target blood pressure may vary depending on your medical conditions,   your age, and other factors. For most people, a normal blood pressure is less than 120/80. Hypertension is managed by lifestyle changes, medicines, or both. Lifestyle changes to help manage hypertension include losing weight, eating a healthy, low-sodium diet, exercising more, stopping smoking, and limiting alcohol. This information is not intended to replace advice given to you by your health care provider. Make sure you discuss any questions you have with your health care  provider. Document Revised: 02/15/2021 Document Reviewed: 02/15/2021 Elsevier Patient Education  2023 [employer]  

## 2022-05-03 NOTE — Progress Notes (Signed)
Subjective:  Patient ID: Jonathan Bush, male    DOB: 1961/02/26  Age: 61 y.o. MRN: [id number]  CC:  Chief Complaint  Patient presents with   Hypertension    Pt states he is still getting refill on 25mg  of the bp meds     HPI Jonathan Bush presents for   Hypertension: Decreased control at his June 16 visit, Toprol was increased to 50 mg daily.  Continued on lisinopril HCTZ 40/25 mg total dose per day and amlodipine 10 mg daily. Golf is slowing down for the winter.  No new side effects with meds. Home machine not working.   BP Readings from Last 3 Encounters:  05/03/22 138/68  11/30/21 (!) 142/76  10/19/21 (!) 160/74   Lab Results  Component Value Date   CREATININE 0.86 10/19/2021   Hyperlipidemia: Lipitor 10 mg daily, no new myalgias/side effects.  Lab Results  Component Value Date   CHOL 140 10/19/2021   HDL 30.40 (L) 10/19/2021   LDLCALC 78 10/19/2021   TRIG 158.0 (H) 10/19/2021   CHOLHDL 5 10/19/2021   Lab Results  Component Value Date   ALT 15 10/19/2021   AST 20 10/19/2021   ALKPHOS 67 10/19/2021   BILITOT 0.6 10/19/2021  Fasting today.    Nicotine addiction 1 pack/day, cessation has discussed previously as well as risks of smoking, we have also discussed low-dose CT scan for lung cancer screening, declined.  Not ready to quit yet. Still declines CT screening.   Trying to cut back on sweets.  Wt Readings from Last 3 Encounters:  05/03/22 224 lb 3.2 oz (101.7 kg)  11/30/21 226 lb 6.4 oz (102.7 kg)  10/19/21 229 lb (103.9 kg)      History Patient Active Problem List   Diagnosis Date Noted   Lumbar pain 01/28/2018   Sciatica of left side 01/28/2018   Musculoskeletal pain 01/28/2018   Muscle spasm 01/28/2018   Essential hypertension 05/02/2017   Hyperlipidemia 05/02/2017   Prediabetes 05/02/2017   Past Medical History:  Diagnosis Date   Hypertension    Past Surgical History:  Procedure Laterality Date   VASECTOMY     No Known  Allergies Prior to Admission medications   Medication Sig Start Date End Date Taking? Authorizing Provider  amLODipine (NORVASC) 10 MG tablet Take 1 tablet (10 mg total) by mouth daily. 10/19/21  Yes 12/19/21, MD  atorvastatin (LIPITOR) 10 MG tablet Take 1 tablet (10 mg total) by mouth daily. 10/19/21  Yes 12/19/21, MD  fish oil-omega-3 fatty acids 1000 MG capsule Take 2 g by mouth daily.   Yes [provider]  glucosamine-chondroitin 500-400 MG tablet Take 1 tablet by mouth 3 (three) times daily.   Yes [provider]  lisinopril-hydrochlorothiazide (ZESTORETIC) 20-12.5 MG tablet Take 2 tablets by mouth daily. 10/19/21  Yes 12/19/21, MD  metoprolol succinate (TOPROL-XL) 50 MG 24 hr tablet Take 1 tablet (50 mg total) by mouth daily. Take with or immediately following a meal. 11/30/21  Yes 12/02/21, MD  Multiple Vitamin (MULTIVITAMIN) tablet Take 1 tablet by mouth daily.   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   Social Determinants of Health   Financial Resource Strain: Not on file  Food Insecurity: Not on file  Transportation Needs: Not on file  Physical Activity: Not on file  Stress: Not on file  Social Connections: Not on  file  Intimate Partner Violence: Not on file    Review of Systems  Constitutional:  Negative for fatigue and unexpected weight change.  Eyes:  Negative for visual disturbance.  Respiratory:  Negative for cough, chest tightness and shortness of breath.   Cardiovascular:  Negative for chest pain, palpitations and leg swelling.  Gastrointestinal:  Negative for abdominal pain and blood in stool.  Neurological:  Negative for dizziness, light-headedness and headaches.     Objective:   Vitals:   05/03/22 0813  BP: 138/68  Pulse: 69  Temp: 98.9 F (37.2 C)  SpO2: 98%  Weight: 224 lb 3.2 oz (101.7 kg)  Height: 6' (1.829 m)     Physical Exam Vitals reviewed.  Constitutional:      Appearance: He is well-developed.  HENT:     Head: Normocephalic and atraumatic.  Neck:     Vascular: No carotid bruit or JVD.  Cardiovascular:     Rate and Rhythm: Normal rate and regular rhythm.     Heart sounds: Normal heart sounds. No murmur heard. Pulmonary:     Effort: Pulmonary effort is normal.     Breath sounds: Normal breath sounds. No rales.  Musculoskeletal:     Right lower leg: No edema.     Left lower leg: No edema.  Skin:    General: Skin is warm and dry.  Neurological:     Mental Status: He is alert and oriented to person, place, and time.  Psychiatric:        Mood and Affect: Mood normal.        Assessment & Plan:  Jonathan AbbeMichael D Bush is a 61 y.o. male . Essential hypertension - Plan: amLODipine (NORVASC) 10 MG tablet, lisinopril-hydrochlorothiazide (ZESTORETIC) 20-12.5 MG tablet, metoprolol succinate (TOPROL-XL) 50 MG 24 hr tablet  -Improved control on 50 mg dose Toprol without any side effects.  Home monitoring recommended with new upper arm monitor.  Continue same dose of other meds for now and check labs.  Hyperlipidemia, unspecified hyperlipidemia type - Plan: atorvastatin (LIPITOR) 10 MG tablet  -  Stable, tolerating current regimen. Medications refilled. Labs pending as  above.   Cigarette nicotine dependence without complication  - risks discussed, not ready to quit, declines CT.   Meds ordered this encounter  Medications   amLODipine (NORVASC) 10 MG tablet    Sig: Take 1 tablet (10 mg total) by mouth daily.    Dispense:  90 tablet    Refill:  2    DX Code Needed  .   atorvastatin (LIPITOR) 10 MG tablet    Sig: Take 1 tablet (10 mg total) by mouth daily.  Dispense:  90 tablet    Refill:  2   lisinopril-hydrochlorothiazide (ZESTORETIC) 20-12.5 MG tablet    Sig: Take 2 tablets by mouth daily.    Dispense:  180 tablet    Refill:  2   metoprolol succinate (TOPROL-XL) 50 MG 24 hr tablet    Sig: Take 1 tablet (50 mg total) by mouth daily. Take with or immediately following a meal.    Dispense:  90 tablet    Refill:  2    Discontinue 25mg  dose.   Patient Instructions  Managing Your Hypertension Hypertension, also called high blood pressure, is when the force of the blood pressing against the walls of the arteries is too strong. Arteries are blood vessels that carry blood from your heart throughout your body. Hypertension forces the heart to work harder to pump blood and may cause the arteries to become narrow or stiff. Understanding blood pressure readings A blood pressure reading includes a higher number over a lower number: The first, or top, number is called the systolic pressure. It is a measure of the pressure in your arteries as your heart beats. The second, or bottom number, is called the diastolic pressure. It is a measure of the pressure in your arteries as the heart relaxes. For most people, a normal blood pressure is below 120/80. Your personal target blood pressure may vary depending on your medical conditions, your age, and other factors. Blood pressure is classified into four stages. Based on your blood pressure reading, your health care provider may use the following stages to determine what type of treatment you need, if any. Systolic  pressure and diastolic pressure are measured in a unit called millimeters of mercury (mmHg). Normal Systolic pressure: below 120. Diastolic pressure: below 80. Elevated Systolic pressure: 120-129. Diastolic pressure: below 80. Hypertension stage 1 Systolic pressure: 130-139. Diastolic pressure: 80-89. Hypertension stage 2 Systolic pressure: 140 or above. Diastolic pressure: 90 or above. How can this condition affect me? Managing your hypertension is very important. Over time, hypertension can damage the arteries and decrease blood flow to parts of the body, including the brain, heart, and kidneys. Having untreated or uncontrolled hypertension can lead to: A heart attack. A stroke. A weakened blood vessel (aneurysm). Heart failure. Kidney damage. Eye damage. Memory and concentration problems. Vascular dementia. What actions can I take to manage this condition? Hypertension can be managed by making lifestyle changes and possibly by taking medicines. Your health care provider will help you make a plan to bring your blood pressure within a normal range. You may be referred for counseling on a healthy diet and physical activity. Nutrition  Eat a diet that is high in fiber and potassium, and low in salt (sodium), added sugar, and fat. An example eating plan is called the DASH diet. DASH stands for Dietary Approaches to Stop Hypertension. To eat this way: Eat plenty of fresh fruits and vegetables. Try to fill one-half of your plate at each meal with fruits and vegetables. Eat whole grains, such as whole-wheat pasta, brown rice, or whole-grain bread. Fill about one-fourth of your plate with whole grains. Eat low-fat dairy products. Avoid fatty cuts of meat, processed or cured meats, and poultry with skin. Fill about one-fourth of your plate with lean proteins such as fish, chicken without skin, beans, eggs, and tofu. Avoid pre-made and processed foods. These tend to be higher in sodium, added  sugar, and fat. Reduce your daily sodium intake. Many people with hypertension should eat less than 1,500  mg of sodium a day. Lifestyle  Work with your health care provider to maintain a healthy body weight or to lose weight. Ask what an ideal weight is for you. Get at least 30 minutes of exercise that causes your heart to beat faster (aerobic exercise) most days of the week. Activities may include walking, swimming, or biking. Include exercise to strengthen your muscles (resistance exercise), such as weight lifting, as part of your weekly exercise routine. Try to do these types of exercises for 30 minutes at least 3 days a week. Do not use any products that contain nicotine or tobacco. These products include cigarettes, chewing tobacco, and vaping devices, such as e-cigarettes. If you need help quitting, ask your health care provider. Control any long-term (chronic) conditions you have, such as high cholesterol or diabetes. Identify your sources of stress and find ways to manage stress. This may include meditation, deep breathing, or making time for fun activities. Alcohol use Do not drink alcohol if: Your health care provider tells you not to drink. You are pregnant, may be pregnant, or are planning to become pregnant. If you drink alcohol: Limit how much you have to: 0-1 drink a day for women. 0-2 drinks a day for men. Know how much alcohol is in your drink. In the [place] one drink equals one 12 oz bottle of beer (355 mL), one 5 oz glass of wine (148 mL), or one 1 oz glass of hard liquor (44 mL). Medicines Your health care provider may prescribe medicine if lifestyle changes are not enough to get your blood pressure under control and if: Your systolic blood pressure is 130 or higher. Your diastolic blood pressure is 80 or higher. Take medicines only as told by your health care provider. Follow the directions carefully. Blood pressure medicines must be taken as told by your health care  provider. The medicine does not work as well when you skip doses. Skipping doses also puts you at risk for problems. Monitoring Before you monitor your blood pressure: Do not smoke, drink caffeinated beverages, or exercise within 30 minutes before taking a measurement. Use the bathroom and empty your bladder (urinate). Sit quietly for at least 5 minutes before taking measurements. Monitor your blood pressure at home as told by your health care provider. To do this: Sit with your back straight and supported. Place your feet flat on the floor. Do not cross your legs. Support your arm on a flat surface, such as a table. Make sure your upper arm is at heart level. Each time you measure, take two or three readings one minute apart and record the results. You may also need to have your blood pressure checked regularly by your health care provider. General information Talk with your health care provider about your diet, exercise habits, and other lifestyle factors that may be contributing to hypertension. Review all the medicines you take with your health care provider because there may be side effects or interactions. Keep all follow-up visits. Your health care provider can help you create and adjust your plan for managing your high blood pressure. Where to find more information National Heart, Lung, and Blood Institute: PopSte[employer].is American Heart Association: www.heart.org Contact a health care provider if: You think you are having a reaction to medicines you have taken. You have repeated (recurrent) headaches. You feel dizzy. You have swelling in your ankles. You have trouble with your vision. Get help right away if: You develop a severe headache or confusion. You have unusual weakness  or numbness, or you feel faint. You have severe pain in your chest or abdomen. You vomit repeatedly. You have trouble breathing. These symptoms may be an emergency. Get help right away. Call 911. Do  not wait to see if the symptoms will go away. Do not drive yourself to the hospital. Summary Hypertension is when the force of blood pumping through your arteries is too strong. If this condition is not controlled, it may put you at risk for serious complications. Your personal target blood pressure may vary depending on your medical conditions, your age, and other factors. For most people, a normal blood pressure is less than 120/80. Hypertension is managed by lifestyle changes, medicines, or both. Lifestyle changes to help manage hypertension include losing weight, eating a healthy, low-sodium diet, exercising more, stopping smoking, and limiting alcohol. This information is not intended to replace advice given to you by your health care provider. Make sure you discuss any questions you have with your health care provider. Document Revised: 02/15/2021 Document Reviewed: 02/15/2021 Elsevier Patient Education  2023 Elsevier Inc.     Signed,   [name], MD [facility], [facility] 05/03/22 9:00 AM

## 2022-05-16 ENCOUNTER — Telehealth: Payer: Self-pay | Admitting: Family Medicine

## 2022-05-16 NOTE — Telephone Encounter (Signed)
Care consideration received from [facility].  Positive Cologuard July 6.  This was discussed with patient and referred to gastroenterology.  Referrals notes reviewed, and based on documentation September 14 he plan to schedule next year.  Please call patient, I would recommend being seen by gastroenterology soon due to the positive Cologuard and need for further testing.  Let me know if he has questions for me or if we can help further.

## 2022-05-17 NOTE — Telephone Encounter (Signed)
Noted  

## 2022-05-17 NOTE — Telephone Encounter (Signed)
Called pt and reiterated your notes and he stated that he has an appointment with GI the first of the year

## 2022-08-29 ENCOUNTER — Telehealth: Payer: No Typology Code available for payment source | Admitting: Family Medicine

## 2022-08-29 ENCOUNTER — Encounter: Payer: Self-pay | Admitting: Family Medicine

## 2022-08-29 DIAGNOSIS — H00015 Hordeolum externum left lower eyelid: Secondary | ICD-10-CM | POA: Diagnosis not present

## 2022-08-29 MED ORDER — ERYTHROMYCIN 5 MG/GM OP OINT: 1.0000 | TOPICAL_OINTMENT | Freq: Three times a day (TID) | OPHTHALMIC | 0 refills | Status: DC

## 2022-08-29 NOTE — Patient Instructions (Signed)
Based on the exam on video it does appear that you have a stye on the left lower lid.  Does not appear to be significantly infected at this time but can apply the erythromycin ointment 3 times per day to that lower eyelid and watch for any increased redness or worsening symptoms.  If that does occur, be seen in person either here or through urgent care/emergency room if needed.  See information below.  Warm compresses few times per day and that should be improving into next week.  Let me know if there are questions.   Stye A stye, also known as a hordeolum, is a bump that forms on an eyelid. It may look like a pimple next to the eyelash. A stye can form inside the eyelid (internal stye) or outside the eyelid (external stye). A stye can cause redness, swelling, and pain on the eyelid. Styes are very common. Anyone can get them at any age. They usually occur in just one eye at a time, but you may have more than one in either eye. What are the causes? A stye is caused by an infection. The infection is almost always caused by bacteria called Staphylococcus aureus. This is a common type of bacteria that lives on the skin. An internal stye may result from an infected oil-producing gland inside the eyelid. An external stye may be caused by an infection at the base of the eyelash (hair follicle). What increases the risk? You are more likely to develop a stye if: You have had a stye before. You have any of these conditions: Red, itchy, inflamed eyelids (blepharitis). A skin condition such as seborrheic dermatitis or rosacea. High fat levels in your blood (lipids). Dry eyes. What are the signs or symptoms? The most common symptom of a stye is eyelid pain. Internal styes are more painful than external styes. Other symptoms may include: Painful swelling of your eyelid. A scratchy feeling in your eye. Tearing and redness of your eye. A pimple-like bump on the edge of the eyelid. Pus draining from the  stye. How is this diagnosed? Your health care provider may be able to diagnose a stye just by examining your eye. The health care provider may also check to make sure: You do not have a fever or other signs of a more serious infection. The infection has not spread to other parts of your eye or areas around your eye. How is this treated? Most styes will clear up in a few days without treatment or with warm compresses applied to the area. You may need to use antibiotic drops or ointment to treat an infection. Sometimes, steroid drops or ointment are used in addition to antibiotics. In some cases, your health care provider may give you a small steroid injection in the eyelid. If your stye does not heal with routine treatment, your health care provider may drain pus from the stye using a thin blade or needle. This may be done if the stye is large, causing a lot of pain, or affecting your vision. Follow these instructions at home: Take over-the-counter and prescription medicines only as told by your health care provider. This includes eye drops or ointments. If you were prescribed an antibiotic medicine, steroid medicine, or both, apply or use them as told by your health care provider. Do not stop using the medicine even if your condition improves. Apply a warm, wet cloth (warm compress) to your eye for 5-10 minutes, 4 to 6 times a day. Clean the  affected eyelid as directed by your health care provider. Do not wear contact lenses or eye makeup until your stye has healed and your health care provider says that it is safe. Do not try to pop or drain the stye. Do not rub your eye. Contact a health care provider if: You have chills or a fever. Your stye does not go away after several days. Your stye affects your vision. Your eyeball becomes swollen, red, or painful. Get help right away if: You have pain when moving your eye around. Summary A stye is a bump that forms on an eyelid. It may look like a  pimple next to the eyelash. A stye can form inside the eyelid (internal stye) or outside the eyelid (external stye). A stye can cause redness, swelling, and pain on the eyelid. Your health care provider may be able to diagnose a stye just by examining your eye. Apply a warm, wet cloth (warm compress) to your eye for 5-10 minutes, 4 to 6 times a day. This information is not intended to replace advice given to you by your health care provider. Make sure you discuss any questions you have with your health care provider. Document Revised: 08/09/2020 Document Reviewed: 08/09/2020 Elsevier Patient Education  [place].

## 2022-08-29 NOTE — Progress Notes (Signed)
Virtual Visit via Video Note  I connected with Jonathan Bush on 08/29/22 at 4:56 PM by a video enabled telemedicine application and verified that I am speaking with the correct person using two identifiers.  Patient location:home, by self My location: office - [place].    I discussed the limitations, risks, security and privacy concerns of performing an evaluation and management service by telephone and the availability of in person appointments. I also discussed with the patient that there may be a patient responsible charge related to this service. The patient expressed understanding and agreed to proceed, consent obtained  Chief complaint:  Chief Complaint  Patient presents with   Stye    Pt noticed stye starting Sunday evening, got worse each day, redness, some swelling, has drained some but not much, notes no effect to vision     History of Present Illness: Jonathan Bush is a 62 y.o. male  Started 4 mornings ago - slight soreness, corner, bottom of left eye.  Has remained swollen in am, improves during day. Minimal white discharge. Possible bump on lid.  No visual c/o.  No headache, diplopia, or pain with EOM motion.  No fever. Feels well otherwise.  Tx: warm water in am only.   Patient Active Problem List   Diagnosis Date Noted   Lumbar pain 01/28/2018   Sciatica of left side 01/28/2018   Musculoskeletal pain 01/28/2018   Muscle spasm 01/28/2018   Essential hypertension 05/02/2017   Hyperlipidemia 05/02/2017   Prediabetes 05/02/2017   Past Medical History:  Diagnosis Date   Hypertension    Past Surgical History:  Procedure Laterality Date   VASECTOMY     No Known Allergies Prior to Admission medications   Medication Sig Start Date End Date Taking? Authorizing Provider  amLODipine (NORVASC) 10 MG tablet Take 1 tablet (10 mg total) by mouth daily. 05/03/22  Yes Wendie Agreste, MD  atorvastatin (LIPITOR) 10 MG tablet Take 1 tablet (10 mg total) by  mouth daily. 05/03/22  Yes Wendie Agreste, MD  fish oil-omega-3 fatty acids 1000 MG capsule Take 2 g by mouth daily.   Yes [provider]  glucosamine-chondroitin 500-400 MG tablet Take 1 tablet by mouth 3 (three) times daily.   Yes [provider]  lisinopril-hydrochlorothiazide (ZESTORETIC) 20-12.5 MG tablet Take 2 tablets by mouth daily. 05/03/22  Yes Wendie Agreste, MD  metoprolol succinate (TOPROL-XL) 50 MG 24 hr tablet Take 1 tablet (50 mg total) by mouth daily. Take with or immediately following a meal. 05/03/22  Yes Wendie Agreste, MD  Multiple Vitamin (MULTIVITAMIN) tablet Take 1 tablet by mouth daily.   Yes [provider]   Social History   Socioeconomic History   Marital status: Unknown    Spouse name: Not on file   Number of children: Not on file   Years of education: Not on file   Highest education level: Not on file  Occupational History   Occupation: Sales    Employer: [employer]  Tobacco Use   Smoking status: Every Day    Packs/day: 1.00    Years: 30.00    Additional pack years: 0.00    Total pack years: 30.00    Types: Cigarettes   Smokeless tobacco: Never  Substance and Sexual Activity   Alcohol use: Yes    Alcohol/week: 12.0 standard drinks of alcohol    Types: 12 Cans of beer per week    Comment: beer   Drug use: No  Sexual activity: Yes    Partners: Female  Other Topics Concern   Not on file  Social History Narrative   Patient walks for exercise.   Social Determinants of Health   Financial Resource Strain: Not on file  Food Insecurity: Not on file  Transportation Needs: Not on file  Physical Activity: Not on file  Stress: Not on file  Social Connections: Not on file  Intimate Partner Violence: Not on file    Observations/Objective: There were no vitals filed for this visit. Nontoxic appearance on video.  Minimal swelling of the lower lid on the left without apparent erythema.  No visible scleral injection  or exudate at canthi.  Appears to have small external hordeolum on the lower left lid.  See photos.  Pain-free EOM motion.  Denies diplopia.     Assessment and Plan: Hordeolum externum of left lower eyelid - Plan: erythromycin ophthalmic ointment Appears to be left lower lid hordeolum, external.  No appreciable infection or signs of preseptal cellulitis at this time.  Erythromycin ointment 3 times daily, warm compresses discussed, RTC/in person precautions for evaluation discussed.  Understanding expressed.  Follow Up Instructions: Patient Instructions  Based on the exam on video it does appear that you have a stye on the left lower lid.  Does not appear to be significantly infected at this time but can apply the erythromycin ointment 3 times per day to that lower eyelid and watch for any increased redness or worsening symptoms.  If that does occur, be seen in person either here or through urgent care/emergency room if needed.  See information below.  Warm compresses few times per day and that should be improving into next week.  Let me know if there are questions.   Stye A stye, also known as a hordeolum, is a bump that forms on an eyelid. It may look like a pimple next to the eyelash. A stye can form inside the eyelid (internal stye) or outside the eyelid (external stye). A stye can cause redness, swelling, and pain on the eyelid. Styes are very common. Anyone can get them at any age. They usually occur in just one eye at a time, but you may have more than one in either eye. What are the causes? A stye is caused by an infection. The infection is almost always caused by bacteria called Staphylococcus aureus. This is a common type of bacteria that lives on the skin. An internal stye may result from an infected oil-producing gland inside the eyelid. An external stye may be caused by an infection at the base of the eyelash (hair follicle). What increases the risk? You are more likely to develop a  stye if: You have had a stye before. You have any of these conditions: Red, itchy, inflamed eyelids (blepharitis). A skin condition such as seborrheic dermatitis or rosacea. High fat levels in your blood (lipids). Dry eyes. What are the signs or symptoms? The most common symptom of a stye is eyelid pain. Internal styes are more painful than external styes. Other symptoms may include: Painful swelling of your eyelid. A scratchy feeling in your eye. Tearing and redness of your eye. A pimple-like bump on the edge of the eyelid. Pus draining from the stye. How is this diagnosed? Your health care provider may be able to diagnose a stye just by examining your eye. The health care provider may also check to make sure: You do not have a fever or other signs of a more serious infection.  The infection has not spread to other parts of your eye or areas around your eye. How is this treated? Most styes will clear up in a few days without treatment or with warm compresses applied to the area. You may need to use antibiotic drops or ointment to treat an infection. Sometimes, steroid drops or ointment are used in addition to antibiotics. In some cases, your health care provider may give you a small steroid injection in the eyelid. If your stye does not heal with routine treatment, your health care provider may drain pus from the stye using a thin blade or needle. This may be done if the stye is large, causing a lot of pain, or affecting your vision. Follow these instructions at home: Take over-the-counter and prescription medicines only as told by your health care provider. This includes eye drops or ointments. If you were prescribed an antibiotic medicine, steroid medicine, or both, apply or use them as told by your health care provider. Do not stop using the medicine even if your condition improves. Apply a warm, wet cloth (warm compress) to your eye for 5-10 minutes, 4 to 6 times a day. Clean the  affected eyelid as directed by your health care provider. Do not wear contact lenses or eye makeup until your stye has healed and your health care provider says that it is safe. Do not try to pop or drain the stye. Do not rub your eye. Contact a health care provider if: You have chills or a fever. Your stye does not go away after several days. Your stye affects your vision. Your eyeball becomes swollen, red, or painful. Get help right away if: You have pain when moving your eye around. Summary A stye is a bump that forms on an eyelid. It may look like a pimple next to the eyelash. A stye can form inside the eyelid (internal stye) or outside the eyelid (external stye). A stye can cause redness, swelling, and pain on the eyelid. Your health care provider may be able to diagnose a stye just by examining your eye. Apply a warm, wet cloth (warm compress) to your eye for 5-10 minutes, 4 to 6 times a day. This information is not intended to replace advice given to you by your health care provider. Make sure you discuss any questions you have with your health care provider. Document Revised: 08/09/2020 Document Reviewed: 08/09/2020 Elsevier Patient Education  [place].     I discussed the assessment and treatment plan with the patient. The patient was provided an opportunity to ask questions and all were answered. The patient agreed with the plan and demonstrated an understanding of the instructions.   The patient was advised to call back or seek an in-person evaluation if the symptoms worsen or if the condition fails to improve as anticipated.   Wendie Agreste, MD

## 2022-10-31 ENCOUNTER — Ambulatory Visit (INDEPENDENT_AMBULATORY_CARE_PROVIDER_SITE_OTHER): Payer: No Typology Code available for payment source | Admitting: Family Medicine

## 2022-10-31 ENCOUNTER — Encounter: Payer: Self-pay | Admitting: Family Medicine

## 2022-10-31 VITALS — BP 140/64 | HR 75 | Temp 98.7°F | Ht 72.0 in | Wt 227.6 lb

## 2022-10-31 DIAGNOSIS — E785 Hyperlipidemia, unspecified: Secondary | ICD-10-CM | POA: Diagnosis not present

## 2022-10-31 DIAGNOSIS — Z0000 Encounter for general adult medical examination without abnormal findings: Secondary | ICD-10-CM

## 2022-10-31 DIAGNOSIS — I10 Essential (primary) hypertension: Secondary | ICD-10-CM

## 2022-10-31 DIAGNOSIS — R195 Other fecal abnormalities: Secondary | ICD-10-CM

## 2022-10-31 DIAGNOSIS — F17210 Nicotine dependence, cigarettes, uncomplicated: Secondary | ICD-10-CM

## 2022-10-31 DIAGNOSIS — R7303 Prediabetes: Secondary | ICD-10-CM | POA: Diagnosis not present

## 2022-10-31 LAB — COMPREHENSIVE METABOLIC PANEL
ALT: 14 U/L (ref 0–53)
AST: 18 U/L (ref 0–37)
Albumin: 4.3 g/dL (ref 3.5–5.2)
Alkaline Phosphatase: 80 U/L (ref 39–117)
BUN: 13 mg/dL (ref 6–23)
CO2: 27 mEq/L (ref 19–32)
Calcium: 9.7 mg/dL (ref 8.4–10.5)
Chloride: 100 mEq/L (ref 96–112)
Creatinine, Ser: 0.92 mg/dL (ref 0.40–1.50)
GFR: 90.57 mL/min (ref >60.00)
Glucose, Bld: 98 mg/dL (ref 70–99)
Potassium: 4.1 mEq/L (ref 3.5–5.1)
Sodium: 136 mEq/L (ref 135–145)
Total Bilirubin: 0.4 mg/dL (ref 0.2–1.2)
Total Protein: 7.6 g/dL (ref 6.0–8.3)

## 2022-10-31 LAB — HEMOGLOBIN A1C: Hgb A1c MFr Bld: 6.1 % (ref 4.6–6.5)

## 2022-10-31 LAB — LIPID PANEL
Cholesterol: 126 mg/dL (ref 0–200)
HDL: 28.1 mg/dL — ABNORMAL LOW (ref >39.00)
LDL Cholesterol: 75 mg/dL (ref 0–99)
NonHDL: 98.11
Total CHOL/HDL Ratio: 4
Triglycerides: 118 mg/dL (ref 0.0–149.0)
VLDL: 23.6 mg/dL (ref 0.0–40.0)

## 2022-10-31 MED ORDER — AMLODIPINE BESYLATE 10 MG PO TABS: 10.0000 mg | ORAL_TABLET | Freq: Every day | ORAL | 2 refills | Status: DC

## 2022-10-31 MED ORDER — LISINOPRIL-HYDROCHLOROTHIAZIDE 20-12.5 MG PO TABS: 2.0000 | ORAL_TABLET | Freq: Every day | ORAL | 2 refills | Status: DC

## 2022-10-31 MED ORDER — METOPROLOL SUCCINATE ER 50 MG PO TB24: 50.0000 mg | ORAL_TABLET | Freq: Every day | ORAL | 2 refills | Status: DC

## 2022-10-31 MED ORDER — ATORVASTATIN CALCIUM 10 MG PO TABS: 10.0000 mg | ORAL_TABLET | Freq: Every day | ORAL | 2 refills | Status: DC

## 2022-10-31 NOTE — Progress Notes (Signed)
Subjective:  Patient ID: Jonathan Bush, male    DOB: [birth year]  Age: 62 y.o. MRN: [id number]  CC:  Chief Complaint  Patient presents with   Annual Exam    Fasting Cpe. Has form from work for CPE.    HPI Jonathan Bush presents for Annual Exam  Hypertension: Current increase of Toprol to 50 mg last year, lisinopril HCTZ 40/25 mg total dose amlodipine 10 mg daily.  No new side effects with meds.  No recent home readings.  Has been inadvertently taking 25mg  dose toprol. On 50mg  past 2 days.  No side effects with 50mg  dose.  BP Readings from Last 3 Encounters:  10/31/22 (!) 140/70  05/03/22 138/68  11/30/21 (!) 142/76   Lab Results  Component Value Date   CREATININE 0.93 05/03/2022   Hyperlipidemia: Lipitor 10 mg daily without new myalgias or side effects. Doing well.  Lab Results  Component Value Date   CHOL 147 05/03/2022   HDL 29.10 (L) 05/03/2022   LDLCALC 90 05/03/2022   TRIG 140.0 05/03/2022   CHOLHDL 5 05/03/2022   Lab Results  Component Value Date   ALT 13 05/03/2022   AST 17 05/03/2022   ALKPHOS 75 05/03/2022   BILITOT 0.5 05/03/2022   Nicotine addiction Smokes 1 pack cigarettes per day.  We have discussed cessation previously as well as low-dose CT scan for lung cancer screening which has been declined.  Not ready to quit and still declines CT screening for lung cancer.  Prediabetes: Weight up 3 pounds from November.  Had try to cut back on sweets previously.  No current meds for prediabetes. Some fast food.  Lab Results  Component Value Date   HGBA1C 5.9 10/19/2021   Wt Readings from Last 3 Encounters:  10/31/22 227 lb 9.6 oz (103.2 kg)  05/03/22 224 lb 3.2 oz (101.7 kg)  11/30/21 226 lb 6.4 oz (102.7 kg)          10/31/2022    8:51 AM 05/03/2022    8:12 AM 10/19/2021    9:26 AM 10/18/2020    8:04 AM 05/31/2020    9:33 AM  Depression screen PHQ 2/9  Decreased Interest 0 0 0 0 0  Down, Depressed, Hopeless 0 0 0 0 0  PHQ - 2 Score 0 0 0 0 0   Altered sleeping 0 0 0    Tired, decreased energy 0 0 0    Change in appetite 0 0 0    Feeling bad or failure about yourself  0 0 0    Trouble concentrating 0 0 0    Moving slowly or fidgety/restless 0 0 0    Suicidal thoughts 0 0 0    PHQ-9 Score 0 0 0    Difficult doing work/chores Not difficult at all  Not difficult at all      Health Maintenance  Topic Date Due   COVID-19 Vaccine (4 - 2023-24 season) 02/15/2022   Zoster Vaccines- Shingrix (1 of 2) 11/29/2022 (Originally 07/27/2012)   Lung Cancer Screening  05/04/2023 (Originally 07/27/2012)   COLONOSCOPY (Pts 45-38yrs Insurance coverage will need to be confirmed)  08/29/2023 (Originally 07/28/2007)   DTaP/Tdap/Td (2 - Td or Tdap) 12/31/2027   Hepatitis C Screening  Completed   HIV Screening  Completed   HPV VACCINES  Aged Out   INFLUENZA VACCINE  Discontinued  Cologuard positive on 12/12/21. Recommended gastroenterology eval prior and today - declines at this time. Aware of possible risks in delay of  diagnosis or treatment if colon cancer present. No abdominal pain, melena or hematochezia. No weight loss, night sweats.  Prostate: does not have family history of prostate cancer The natural history of prostate cancer and ongoing controversy regarding screening and potential treatment outcomes of prostate cancer has been discussed with the patient. The meaning of a false positive PSA and a false negative PSA has been discussed. He indicates understanding of the limitations of this screening test and wishes to not proceed with screening PSA testing. Defers this year.  Lab Results  Component Value Date   PSA1 0.5 12/30/2017   PSA 0.43 10/19/2021   PSA 0.50 10/18/2020    Immunization History  Administered Date(s) Administered   Moderna Sars-Covid-2 Vaccination 08/31/2019, 09/28/2019, 05/30/2020   Tdap 12/30/2017  Covid booster and flu vaccine recommended in the fall.  RSV vaccine - declines.  Shingrix - declines.   No results  found. Scheduling appointment.   Dental: every 6 months.   Alcohol: on weekends,  6-7 beers on Saturday.   Tobacco: 1 pack per day.   Exercise: golfer. Tournament today and this weekend.    History Patient Active Problem List   Diagnosis Date Noted   Lumbar pain 01/28/2018   Sciatica of left side 01/28/2018   Musculoskeletal pain 01/28/2018   Muscle spasm 01/28/2018   Essential hypertension 05/02/2017   Hyperlipidemia 05/02/2017   Prediabetes 05/02/2017   Past Medical History:  Diagnosis Date   Hypertension    Past Surgical History:  Procedure Laterality Date   VASECTOMY     No Known Allergies Prior to Admission medications   Medication Sig Start Date End Date Taking? Authorizing Provider  amLODipine (NORVASC) 10 MG tablet Take 1 tablet (10 mg total) by mouth daily. 05/03/22  Yes Shade Flood, MD  atorvastatin (LIPITOR) 10 MG tablet Take 1 tablet (10 mg total) by mouth daily. 05/03/22  Yes Shade Flood, MD  fish oil-omega-3 fatty acids 1000 MG capsule Take 2 g by mouth daily.   Yes [provider]  glucosamine-chondroitin 500-400 MG tablet Take 1 tablet by mouth 3 (three) times daily.   Yes [provider]  lisinopril-hydrochlorothiazide (ZESTORETIC) 20-12.5 MG tablet Take 2 tablets by mouth daily. 05/03/22  Yes Shade Flood, MD  metoprolol succinate (TOPROL-XL) 50 MG 24 hr tablet Take 1 tablet (50 mg total) by mouth daily. Take with or immediately following a meal. 05/03/22  Yes Shade Flood, MD  Multiple Vitamin (MULTIVITAMIN) tablet Take 1 tablet by mouth daily.   Yes [provider]  erythromycin ophthalmic ointment Place 1 Application into the left eye 3 (three) times daily. 1/2 inch ribbon to lower lid for 5-7 days. Patient not taking: Reported on 10/31/2022 08/29/22   Shade Flood, MD   Social History   Socioeconomic History   Marital status: Unknown    Spouse name: Not on file   Number of children: Not on file    Years of education: Not on file   Highest education level: Not on file  Occupational History   Occupation: Sales    Employer: [employer]  Tobacco Use   Smoking status: Every Day    Packs/day: 1.00    Years: 30.00    Additional pack years: 0.00    Total pack years: 30.00    Types: Cigarettes   Smokeless tobacco: Never  Substance and Sexual Activity   Alcohol use: Yes    Alcohol/week: 12.0 standard drinks of alcohol    Types: 12  Cans of beer per week    Comment: beer   Drug use: No   Sexual activity: Yes    Partners: Female  Other Topics Concern   Not on file  Social History Narrative   Patient walks for exercise.   Social Determinants of Health   Financial Resource Strain: Not on file  Food Insecurity: Not on file  Transportation Needs: Not on file  Physical Activity: Not on file  Stress: Not on file  Social Connections: Not on file  Intimate Partner Violence: Not on file    Review of Systems  13 point review of systems per patient health survey noted.  Negative other than as indicated above or in HPI.   Objective:   Vitals:   10/31/22 0845  BP: (!) 140/70  Pulse: 75  Temp: 98.7 F (37.1 C)  SpO2: 97%  Weight: 227 lb 9.6 oz (103.2 kg)  Height: 6' (1.829 m)     Physical Exam Vitals reviewed.  Constitutional:      Appearance: He is well-developed.  HENT:     Head: Normocephalic and atraumatic.     Right Ear: External ear normal.     Left Ear: External ear normal.  Eyes:     Conjunctiva/sclera: Conjunctivae normal.     Pupils: Pupils are equal, round, and reactive to light.  Neck:     Thyroid: No thyromegaly.  Cardiovascular:     Rate and Rhythm: Normal rate and regular rhythm.     Heart sounds: Normal heart sounds.  Pulmonary:     Effort: Pulmonary effort is normal. No respiratory distress.     Breath sounds: Normal breath sounds. No wheezing.  Abdominal:     General: There is no distension.     Palpations: Abdomen is soft.     Tenderness:  There is no abdominal tenderness.  Musculoskeletal:        General: No tenderness. Normal range of motion.     Cervical back: Normal range of motion and neck supple.  Lymphadenopathy:     Cervical: No cervical adenopathy.  Skin:    General: Skin is warm and dry.  Neurological:     Mental Status: He is alert and oriented to person, place, and time.     Deep Tendon Reflexes: Reflexes are normal and symmetric.  Psychiatric:        Behavior: Behavior normal.        Assessment & Plan:  Jonathan Bush is a 62 y.o. male . Annual physical exam  - -anticipatory guidance as below in AVS, screening labs above. Health maintenance items as above in HPI discussed/recommended as applicable.   Prediabetes - Plan: Hemoglobin A1c  -Check A1c, diet/exercise approach.  Recommended cutting back on fast food.  Hyperlipidemia, unspecified hyperlipidemia type - Plan: Lipid panel, Comprehensive metabolic panel, atorvastatin (LIPITOR) 10 MG tablet  -Check labs, tolerating Lipitor, continue same dose.  Avoidance of fast foods, dietary recommendations as above.  Essential hypertension - Plan: Comprehensive metabolic panel, amLODipine (NORVASC) 10 MG tablet, lisinopril-hydrochlorothiazide (ZESTORETIC) 20-12.5 MG tablet, metoprolol succinate (TOPROL-XL) 50 MG 24 hr tablet  -Tolerating current meds, continue same, RTC precautions if elevated on home monitoring.  Check labs and adjust regimen accordingly.  Positive colorectal cancer screening using Cologuard test  -Prior positive Cologuard, again recommended follow-up with gastroenterology for likely diagnostic colonoscopy.  Declined at this time.  He is considering performing this later in the year.  We discussed potential risks of positive Cologuard including underlying colon cancer, risks  of untreated or delayed diagnosis and treatment of colon cancer were discussed with understanding expressed.   Cigarette nicotine dependence without complication  -Not  ready for smoking cessation at this time and declines lung cancer screening.  Meds ordered this encounter  Medications   amLODipine (NORVASC) 10 MG tablet    Sig: Take 1 tablet (10 mg total) by mouth daily.    Dispense:  90 tablet    Refill:  2    DX Code Needed  .   atorvastatin (LIPITOR) 10 MG tablet    Sig: Take 1 tablet (10 mg total) by mouth daily.    Dispense:  90 tablet    Refill:  2   lisinopril-hydrochlorothiazide (ZESTORETIC) 20-12.5 MG tablet    Sig: Take 2 tablets by mouth daily.    Dispense:  180 tablet    Refill:  2   metoprolol succinate (TOPROL-XL) 50 MG 24 hr tablet    Sig: Take 1 tablet (50 mg total) by mouth daily. Take with or immediately following a meal.    Dispense:  90 tablet    Refill:  2    Discontinue 25mg  dose.   Patient Instructions  Keep a record of your blood pressures outside of the office and if remains over 130/80 on the 50mg  metoprolol let me know.  Let me know if we can help when ready to quit smoking, and if you would agree to lung cancer screening with CT scan.  Cut back on fast food.  With prior positive cologuard I do recommend colonoscopy. Please let me know if you change your mind.   Take care and good luck in the tournaments.    Preventive Care 47-12 Years Old, Male Preventive care refers to lifestyle choices and visits with your health care provider that can promote health and wellness. Preventive care visits are also called wellness exams. What can I expect for my preventive care visit? Counseling During your preventive care visit, your health care provider may ask about your: Medical history, including: Past medical problems. Family medical history. Current health, including: Emotional well-being. Home life and relationship well-being. Sexual activity. Lifestyle, including: Alcohol, nicotine or tobacco, and drug use. Access to firearms. Diet, exercise, and sleep habits. Safety issues such as seatbelt and bike helmet  use. Sunscreen use. Work and work Astronomer. Physical exam Your health care provider will check your: Height and weight. These may be used to calculate your BMI (body mass index). BMI is a measurement that tells if you are at a healthy weight. Waist circumference. This measures the distance around your waistline. This measurement also tells if you are at a healthy weight and may help predict your risk of certain diseases, such as type 2 diabetes and high blood pressure. Heart rate and blood pressure. Body temperature. Skin for abnormal spots. What immunizations do I need?  Vaccines are usually given at various ages, according to a schedule. Your health care provider will recommend vaccines for you based on your age, medical history, and lifestyle or other factors, such as travel or where you work. What tests do I need? Screening Your health care provider may recommend screening tests for certain conditions. This may include: Lipid and cholesterol levels. Diabetes screening. This is done by checking your blood sugar (glucose) after you have not eaten for a while (fasting). Hepatitis B test. Hepatitis C test. HIV (human immunodeficiency virus) test. STI (sexually transmitted infection) testing, if you are at risk. Lung cancer screening. Prostate cancer screening. Colorectal cancer screening.  Talk with your health care provider about your test results, treatment options, and if necessary, the need for more tests. Follow these instructions at home: Eating and drinking  Eat a diet that includes fresh fruits and vegetables, whole grains, lean protein, and low-fat dairy products. Take vitamin and mineral supplements as recommended by your health care provider. Do not drink alcohol if your health care provider tells you not to drink. If you drink alcohol: Limit how much you have to 0-2 drinks a day. Know how much alcohol is in your drink. In the [place] one drink equals one 12 oz bottle of  beer (355 mL), one 5 oz glass of wine (148 mL), or one 1 oz glass of hard liquor (44 mL). Lifestyle Brush your teeth every morning and night with fluoride toothpaste. Floss one time each day. Exercise for at least 30 minutes 5 or more days each week. Do not use any products that contain nicotine or tobacco. These products include cigarettes, chewing tobacco, and vaping devices, such as e-cigarettes. If you need help quitting, ask your health care provider. Do not use drugs. If you are sexually active, practice safe sex. Use a condom or other form of protection to prevent STIs. Take aspirin only as told by your health care provider. Make sure that you understand how much to take and what form to take. Work with your health care provider to find out whether it is safe and beneficial for you to take aspirin daily. Find healthy ways to manage stress, such as: Meditation, yoga, or listening to music. Journaling. Talking to a trusted person. Spending time with friends and family. Minimize exposure to UV radiation to reduce your risk of skin cancer. Safety Always wear your seat belt while driving or riding in a vehicle. Do not drive: If you have been drinking alcohol. Do not ride with someone who has been drinking. When you are tired or distracted. While texting. If you have been using any mind-altering substances or drugs. Wear a helmet and other protective equipment during sports activities. If you have firearms in your house, make sure you follow all gun safety procedures. What's next? Go to your health care provider once a year for an annual wellness visit. Ask your health care provider how often you should have your eyes and teeth checked. Stay up to date on all vaccines. This information is not intended to replace advice given to you by your health care provider. Make sure you discuss any questions you have with your health care provider. Document Revised: 11/29/2020 Document Reviewed:  11/29/2020 Elsevier Patient Education  2023 Elsevier Inc.   Managing Your Hypertension Hypertension, also called high blood pressure, is when the force of the blood pressing against the walls of the arteries is too strong. Arteries are blood vessels that carry blood from your heart throughout your body. Hypertension forces the heart to work harder to pump blood and may cause the arteries to become narrow or stiff. Understanding blood pressure readings A blood pressure reading includes a higher number over a lower number: The first, or top, number is called the systolic pressure. It is a measure of the pressure in your arteries as your heart beats. The second, or bottom number, is called the diastolic pressure. It is a measure of the pressure in your arteries as the heart relaxes. For most people, a normal blood pressure is below 120/80. Your personal target blood pressure may vary depending on your medical conditions, your age, and other factors.  Blood pressure is classified into four stages. Based on your blood pressure reading, your health care provider may use the following stages to determine what type of treatment you need, if any. Systolic pressure and diastolic pressure are measured in a unit called millimeters of mercury (mmHg). Normal Systolic pressure: below 120. Diastolic pressure: below 80. Elevated Systolic pressure: 120-129. Diastolic pressure: below 80. Hypertension stage 1 Systolic pressure: 130-139. Diastolic pressure: 80-89. Hypertension stage 2 Systolic pressure: 140 or above. Diastolic pressure: 90 or above. How can this condition affect me? Managing your hypertension is very important. Over time, hypertension can damage the arteries and decrease blood flow to parts of the body, including the brain, heart, and kidneys. Having untreated or uncontrolled hypertension can lead to: A heart attack. A stroke. A weakened blood vessel (aneurysm). Heart failure. Kidney  damage. Eye damage. Memory and concentration problems. Vascular dementia. What actions can I take to manage this condition? Hypertension can be managed by making lifestyle changes and possibly by taking medicines. Your health care provider will help you make a plan to bring your blood pressure within a normal range. You may be referred for counseling on a healthy diet and physical activity. Nutrition  Eat a diet that is high in fiber and potassium, and low in salt (sodium), added sugar, and fat. An example eating plan is called the DASH diet. DASH stands for Dietary Approaches to Stop Hypertension. To eat this way: Eat plenty of fresh fruits and vegetables. Try to fill one-half of your plate at each meal with fruits and vegetables. Eat whole grains, such as whole-wheat pasta, brown rice, or whole-grain bread. Fill about one-fourth of your plate with whole grains. Eat low-fat dairy products. Avoid fatty cuts of meat, processed or cured meats, and poultry with skin. Fill about one-fourth of your plate with lean proteins such as fish, chicken without skin, beans, eggs, and tofu. Avoid pre-made and processed foods. These tend to be higher in sodium, added sugar, and fat. Reduce your daily sodium intake. Many people with hypertension should eat less than 1,500 mg of sodium a day. Lifestyle  Work with your health care provider to maintain a healthy body weight or to lose weight. Ask what an ideal weight is for you. Get at least 30 minutes of exercise that causes your heart to beat faster (aerobic exercise) most days of the week. Activities may include walking, swimming, or biking. Include exercise to strengthen your muscles (resistance exercise), such as weight lifting, as part of your weekly exercise routine. Try to do these types of exercises for 30 minutes at least 3 days a week. Do not use any products that contain nicotine or tobacco. These products include cigarettes, chewing tobacco, and vaping  devices, such as e-cigarettes. If you need help quitting, ask your health care provider. Control any long-term (chronic) conditions you have, such as high cholesterol or diabetes. Identify your sources of stress and find ways to manage stress. This may include meditation, deep breathing, or making time for fun activities. Alcohol use Do not drink alcohol if: Your health care provider tells you not to drink. You are pregnant, may be pregnant, or are planning to become pregnant. If you drink alcohol: Limit how much you have to: 0-1 drink a day for women. 0-2 drinks a day for men. Know how much alcohol is in your drink. In the [place] one drink equals one 12 oz bottle of beer (355 mL), one 5 oz glass of wine (148 mL), or one  1 oz glass of hard liquor (44 mL). Medicines Your health care provider may prescribe medicine if lifestyle changes are not enough to get your blood pressure under control and if: Your systolic blood pressure is 130 or higher. Your diastolic blood pressure is 80 or higher. Take medicines only as told by your health care provider. Follow the directions carefully. Blood pressure medicines must be taken as told by your health care provider. The medicine does not work as well when you skip doses. Skipping doses also puts you at risk for problems. Monitoring Before you monitor your blood pressure: Do not smoke, drink caffeinated beverages, or exercise within 30 minutes before taking a measurement. Use the bathroom and empty your bladder (urinate). Sit quietly for at least 5 minutes before taking measurements. Monitor your blood pressure at home as told by your health care provider. To do this: Sit with your back straight and supported. Place your feet flat on the floor. Do not cross your legs. Support your arm on a flat surface, such as a table. Make sure your upper arm is at heart level. Each time you measure, take two or three readings one minute apart and record the  results. You may also need to have your blood pressure checked regularly by your health care provider. General information Talk with your health care provider about your diet, exercise habits, and other lifestyle factors that may be contributing to hypertension. Review all the medicines you take with your health care provider because there may be side effects or interactions. Keep all follow-up visits. Your health care provider can help you create and adjust your plan for managing your high blood pressure. Where to find more information National Heart, Lung, and Blood Institute: PopSteam.is American Heart Association: www.heart.org Contact a health care provider if: You think you are having a reaction to medicines you have taken. You have repeated (recurrent) headaches. You feel dizzy. You have swelling in your ankles. You have trouble with your vision. Get help right away if: You develop a severe headache or confusion. You have unusual weakness or numbness, or you feel faint. You have severe pain in your chest or abdomen. You vomit repeatedly. You have trouble breathing. These symptoms may be an emergency. Get help right away. Call 911. Do not wait to see if the symptoms will go away. Do not drive yourself to the hospital. Summary Hypertension is when the force of blood pumping through your arteries is too strong. If this condition is not controlled, it may put you at risk for serious complications. Your personal target blood pressure may vary depending on your medical conditions, your age, and other factors. For most people, a normal blood pressure is less than 120/80. Hypertension is managed by lifestyle changes, medicines, or both. Lifestyle changes to help manage hypertension include losing weight, eating a healthy, low-sodium diet, exercising more, stopping smoking, and limiting alcohol. This information is not intended to replace advice given to you by your health care  provider. Make sure you discuss any questions you have with your health care provider. Document Revised: 02/15/2021 Document Reviewed: 02/15/2021 Elsevier Patient Education  2023 Elsevier Inc.     Signed,   [name], MD [facility], [facility] 10/31/22 9:32 AM

## 2022-10-31 NOTE — Patient Instructions (Addendum)
Keep a record of your blood pressures outside of the office and if remains over 130/80 on the 50mg  metoprolol let me know.  Let me know if we can help when ready to quit smoking, and if you would agree to lung cancer screening with CT scan.  Cut back on fast food.  With prior positive cologuard I do recommend colonoscopy. Please let me know if you change your mind.   Take care and good luck in the tournaments.    Preventive Care 47-60 Years Old, Male Preventive care refers to lifestyle choices and visits with your health care provider that can promote health and wellness. Preventive care visits are also called wellness exams. What can I expect for my preventive care visit? Counseling During your preventive care visit, your health care provider may ask about your: Medical history, including: Past medical problems. Family medical history. Current health, including: Emotional well-being. Home life and relationship well-being. Sexual activity. Lifestyle, including: Alcohol, nicotine or tobacco, and drug use. Access to firearms. Diet, exercise, and sleep habits. Safety issues such as seatbelt and bike helmet use. Sunscreen use. Work and work Astronomer. Physical exam Your health care provider will check your: Height and weight. These may be used to calculate your BMI (body mass index). BMI is a measurement that tells if you are at a healthy weight. Waist circumference. This measures the distance around your waistline. This measurement also tells if you are at a healthy weight and may help predict your risk of certain diseases, such as type 2 diabetes and high blood pressure. Heart rate and blood pressure. Body temperature. Skin for abnormal spots. What immunizations do I need?  Vaccines are usually given at various ages, according to a schedule. Your health care provider will recommend vaccines for you based on your age, medical history, and lifestyle or other factors, such as travel  or where you work. What tests do I need? Screening Your health care provider may recommend screening tests for certain conditions. This may include: Lipid and cholesterol levels. Diabetes screening. This is done by checking your blood sugar (glucose) after you have not eaten for a while (fasting). Hepatitis B test. Hepatitis C test. HIV (human immunodeficiency virus) test. STI (sexually transmitted infection) testing, if you are at risk. Lung cancer screening. Prostate cancer screening. Colorectal cancer screening. Talk with your health care provider about your test results, treatment options, and if necessary, the need for more tests. Follow these instructions at home: Eating and drinking  Eat a diet that includes fresh fruits and vegetables, whole grains, lean protein, and low-fat dairy products. Take vitamin and mineral supplements as recommended by your health care provider. Do not drink alcohol if your health care provider tells you not to drink. If you drink alcohol: Limit how much you have to 0-2 drinks a day. Know how much alcohol is in your drink. In the [place] one drink equals one 12 oz bottle of beer (355 mL), one 5 oz glass of wine (148 mL), or one 1 oz glass of hard liquor (44 mL). Lifestyle Brush your teeth every morning and night with fluoride toothpaste. Floss one time each day. Exercise for at least 30 minutes 5 or more days each week. Do not use any products that contain nicotine or tobacco. These products include cigarettes, chewing tobacco, and vaping devices, such as e-cigarettes. If you need help quitting, ask your health care provider. Do not use drugs. If you are sexually active, practice safe sex. Use a condom or  other form of protection to prevent STIs. Take aspirin only as told by your health care provider. Make sure that you understand how much to take and what form to take. Work with your health care provider to find out whether it is safe and beneficial for  you to take aspirin daily. Find healthy ways to manage stress, such as: Meditation, yoga, or listening to music. Journaling. Talking to a trusted person. Spending time with friends and family. Minimize exposure to UV radiation to reduce your risk of skin cancer. Safety Always wear your seat belt while driving or riding in a vehicle. Do not drive: If you have been drinking alcohol. Do not ride with someone who has been drinking. When you are tired or distracted. While texting. If you have been using any mind-altering substances or drugs. Wear a helmet and other protective equipment during sports activities. If you have firearms in your house, make sure you follow all gun safety procedures. What's next? Go to your health care provider once a year for an annual wellness visit. Ask your health care provider how often you should have your eyes and teeth checked. Stay up to date on all vaccines. This information is not intended to replace advice given to you by your health care provider. Make sure you discuss any questions you have with your health care provider. Document Revised: 11/29/2020 Document Reviewed: 11/29/2020 Elsevier Patient Education  2023 Elsevier Inc.   Managing Your Hypertension Hypertension, also called high blood pressure, is when the force of the blood pressing against the walls of the arteries is too strong. Arteries are blood vessels that carry blood from your heart throughout your body. Hypertension forces the heart to work harder to pump blood and may cause the arteries to become narrow or stiff. Understanding blood pressure readings A blood pressure reading includes a higher number over a lower number: The first, or top, number is called the systolic pressure. It is a measure of the pressure in your arteries as your heart beats. The second, or bottom number, is called the diastolic pressure. It is a measure of the pressure in your arteries as the heart relaxes. For  most people, a normal blood pressure is below 120/80. Your personal target blood pressure may vary depending on your medical conditions, your age, and other factors. Blood pressure is classified into four stages. Based on your blood pressure reading, your health care provider may use the following stages to determine what type of treatment you need, if any. Systolic pressure and diastolic pressure are measured in a unit called millimeters of mercury (mmHg). Normal Systolic pressure: below 120. Diastolic pressure: below 80. Elevated Systolic pressure: 120-129. Diastolic pressure: below 80. Hypertension stage 1 Systolic pressure: 130-139. Diastolic pressure: 80-89. Hypertension stage 2 Systolic pressure: 140 or above. Diastolic pressure: 90 or above. How can this condition affect me? Managing your hypertension is very important. Over time, hypertension can damage the arteries and decrease blood flow to parts of the body, including the brain, heart, and kidneys. Having untreated or uncontrolled hypertension can lead to: A heart attack. A stroke. A weakened blood vessel (aneurysm). Heart failure. Kidney damage. Eye damage. Memory and concentration problems. Vascular dementia. What actions can I take to manage this condition? Hypertension can be managed by making lifestyle changes and possibly by taking medicines. Your health care provider will help you make a plan to bring your blood pressure within a normal range. You may be referred for counseling on a healthy diet and  physical activity. Nutrition  Eat a diet that is high in fiber and potassium, and low in salt (sodium), added sugar, and fat. An example eating plan is called the DASH diet. DASH stands for Dietary Approaches to Stop Hypertension. To eat this way: Eat plenty of fresh fruits and vegetables. Try to fill one-half of your plate at each meal with fruits and vegetables. Eat whole grains, such as whole-wheat pasta, brown rice, or  whole-grain bread. Fill about one-fourth of your plate with whole grains. Eat low-fat dairy products. Avoid fatty cuts of meat, processed or cured meats, and poultry with skin. Fill about one-fourth of your plate with lean proteins such as fish, chicken without skin, beans, eggs, and tofu. Avoid pre-made and processed foods. These tend to be higher in sodium, added sugar, and fat. Reduce your daily sodium intake. Many people with hypertension should eat less than 1,500 mg of sodium a day. Lifestyle  Work with your health care provider to maintain a healthy body weight or to lose weight. Ask what an ideal weight is for you. Get at least 30 minutes of exercise that causes your heart to beat faster (aerobic exercise) most days of the week. Activities may include walking, swimming, or biking. Include exercise to strengthen your muscles (resistance exercise), such as weight lifting, as part of your weekly exercise routine. Try to do these types of exercises for 30 minutes at least 3 days a week. Do not use any products that contain nicotine or tobacco. These products include cigarettes, chewing tobacco, and vaping devices, such as e-cigarettes. If you need help quitting, ask your health care provider. Control any long-term (chronic) conditions you have, such as high cholesterol or diabetes. Identify your sources of stress and find ways to manage stress. This may include meditation, deep breathing, or making time for fun activities. Alcohol use Do not drink alcohol if: Your health care provider tells you not to drink. You are pregnant, may be pregnant, or are planning to become pregnant. If you drink alcohol: Limit how much you have to: 0-1 drink a day for women. 0-2 drinks a day for men. Know how much alcohol is in your drink. In the [place] one drink equals one 12 oz bottle of beer (355 mL), one 5 oz glass of wine (148 mL), or one 1 oz glass of hard liquor (44 mL). Medicines Your health care  provider may prescribe medicine if lifestyle changes are not enough to get your blood pressure under control and if: Your systolic blood pressure is 130 or higher. Your diastolic blood pressure is 80 or higher. Take medicines only as told by your health care provider. Follow the directions carefully. Blood pressure medicines must be taken as told by your health care provider. The medicine does not work as well when you skip doses. Skipping doses also puts you at risk for problems. Monitoring Before you monitor your blood pressure: Do not smoke, drink caffeinated beverages, or exercise within 30 minutes before taking a measurement. Use the bathroom and empty your bladder (urinate). Sit quietly for at least 5 minutes before taking measurements. Monitor your blood pressure at home as told by your health care provider. To do this: Sit with your back straight and supported. Place your feet flat on the floor. Do not cross your legs. Support your arm on a flat surface, such as a table. Make sure your upper arm is at heart level. Each time you measure, take two or three readings one minute apart and  record the results. You may also need to have your blood pressure checked regularly by your health care provider. General information Talk with your health care provider about your diet, exercise habits, and other lifestyle factors that may be contributing to hypertension. Review all the medicines you take with your health care provider because there may be side effects or interactions. Keep all follow-up visits. Your health care provider can help you create and adjust your plan for managing your high blood pressure. Where to find more information National Heart, Lung, and Blood Institute: PopSteam.is American Heart Association: www.heart.org Contact a health care provider if: You think you are having a reaction to medicines you have taken. You have repeated (recurrent) headaches. You feel  dizzy. You have swelling in your ankles. You have trouble with your vision. Get help right away if: You develop a severe headache or confusion. You have unusual weakness or numbness, or you feel faint. You have severe pain in your chest or abdomen. You vomit repeatedly. You have trouble breathing. These symptoms may be an emergency. Get help right away. Call 911. Do not wait to see if the symptoms will go away. Do not drive yourself to the hospital. Summary Hypertension is when the force of blood pumping through your arteries is too strong. If this condition is not controlled, it may put you at risk for serious complications. Your personal target blood pressure may vary depending on your medical conditions, your age, and other factors. For most people, a normal blood pressure is less than 120/80. Hypertension is managed by lifestyle changes, medicines, or both. Lifestyle changes to help manage hypertension include losing weight, eating a healthy, low-sodium diet, exercising more, stopping smoking, and limiting alcohol. This information is not intended to replace advice given to you by your health care provider. Make sure you discuss any questions you have with your health care provider. Document Revised: 02/15/2021 Document Reviewed: 02/15/2021 Elsevier Patient Education  2023 [employer].

## 2023-05-08 ENCOUNTER — Ambulatory Visit: Payer: No Typology Code available for payment source | Admitting: Family Medicine

## 2023-05-08 ENCOUNTER — Encounter: Payer: Self-pay | Admitting: Family Medicine

## 2023-05-08 VITALS — BP 122/60 | HR 76 | Temp 98.1°F | Ht 72.0 in | Wt 223.8 lb

## 2023-05-08 DIAGNOSIS — R195 Other fecal abnormalities: Secondary | ICD-10-CM

## 2023-05-08 DIAGNOSIS — F17210 Nicotine dependence, cigarettes, uncomplicated: Secondary | ICD-10-CM

## 2023-05-08 DIAGNOSIS — E785 Hyperlipidemia, unspecified: Secondary | ICD-10-CM

## 2023-05-08 DIAGNOSIS — I10 Essential (primary) hypertension: Secondary | ICD-10-CM | POA: Diagnosis not present

## 2023-05-08 DIAGNOSIS — R7303 Prediabetes: Secondary | ICD-10-CM | POA: Diagnosis not present

## 2023-05-08 LAB — LIPID PANEL
Cholesterol: 129 mg/dL (ref 0–200)
HDL: 25.2 mg/dL — ABNORMAL LOW (ref >39.00)
LDL Cholesterol: 83 mg/dL (ref 0–99)
NonHDL: 104.08
Total CHOL/HDL Ratio: 5
Triglycerides: 107 mg/dL (ref 0.0–149.0)
VLDL: 21.4 mg/dL (ref 0.0–40.0)

## 2023-05-08 LAB — COMPREHENSIVE METABOLIC PANEL
ALT: 10 U/L (ref 0–53)
AST: 15 U/L (ref 0–37)
Albumin: 4.2 g/dL (ref 3.5–5.2)
Alkaline Phosphatase: 107 U/L (ref 39–117)
BUN: 10 mg/dL (ref 6–23)
CO2: 27 meq/L (ref 19–32)
Calcium: 9.5 mg/dL (ref 8.4–10.5)
Chloride: 101 meq/L (ref 96–112)
Creatinine, Ser: 0.8 mg/dL (ref 0.40–1.50)
GFR: 96.01 mL/min (ref >60.00)
Glucose, Bld: 104 mg/dL — ABNORMAL HIGH (ref 70–99)
Potassium: 3.8 meq/L (ref 3.5–5.1)
Sodium: 134 meq/L — ABNORMAL LOW (ref 135–145)
Total Bilirubin: 0.5 mg/dL (ref 0.2–1.2)
Total Protein: 7.5 g/dL (ref 6.0–8.3)

## 2023-05-08 LAB — HEMOGLOBIN A1C: Hgb A1c MFr Bld: 6.4 % (ref 4.6–6.5)

## 2023-05-08 MED ORDER — METOPROLOL SUCCINATE ER 50 MG PO TB24: 50.0000 mg | ORAL_TABLET | Freq: Every day | ORAL | 2 refills | Status: DC

## 2023-05-08 MED ORDER — LISINOPRIL-HYDROCHLOROTHIAZIDE 20-12.5 MG PO TABS: 2.0000 | ORAL_TABLET | Freq: Every day | ORAL | 2 refills | Status: DC

## 2023-05-08 MED ORDER — ATORVASTATIN CALCIUM 10 MG PO TABS: 10.0000 mg | ORAL_TABLET | Freq: Every day | ORAL | 2 refills | Status: DC

## 2023-05-08 MED ORDER — AMLODIPINE BESYLATE 10 MG PO TABS: 10.0000 mg | ORAL_TABLET | Freq: Every day | ORAL | 2 refills | Status: DC

## 2023-05-08 NOTE — Patient Instructions (Signed)
No med changes at this time.  I did place a referral to gastroenterology, they should be calling you soon.  Keep up the good work with healthier eating, exercise during the off season, and that should help with weight as well as blood pressure control, blood sugar control as well as cholesterol.  Let me know if you need assistance quitting smoking when you are ready.  Let me know if there are questions and take care.

## 2023-05-08 NOTE — Progress Notes (Signed)
Subjective:  Patient ID: Jonathan Bush, male    DOB: June 08, 1961  Age: 62 y.o. MRN: [id number]  CC:  Chief Complaint  Patient presents with   Medical Management of Chronic Issues    Pt notes doing well, notes no concerns has started to lose weight on his own     HPI Jonathan Bush presents for   Hypertension: Treated with Toprol 50 mg daily, lisinopril HCTZ 40/25 mg total per day and amlodipine 10 mg daily.  Had been inadvertently taking 25 mg dose of Toprol at his May visit, then increase to 50 mg on the previous 2 days.  No side effects with that higher dose. Home readings: 130/75 BP Readings from Last 3 Encounters:  05/08/23 122/60  10/31/22 (!) 140/64  05/03/22 138/68   Lab Results  Component Value Date   CREATININE 0.92 10/31/2022   Hyperlipidemia: Lipitor 10 mg daily without any side effects or myalgias. Fasting today.  Lab Results  Component Value Date   CHOL 126 10/31/2022   HDL 28.10 (L) 10/31/2022   LDLCALC 75 10/31/2022   TRIG 118.0 10/31/2022   CHOLHDL 4 10/31/2022   Lab Results  Component Value Date   ALT 14 10/31/2022   AST 18 10/31/2022   ALKPHOS 80 10/31/2022   BILITOT 0.4 10/31/2022   Prediabetes: Diet/exercise approach.  Weight has improved 4 pounds since last visit.  Fast food avoidance discussed previously. Cut back on ice cream, now frozen grapes, cut back on portions. Commended on efforts - his goal is 200-205. Golfing slows down - tournament last Saturday. Decreased exercise in off season - plans to exercise more this year.  Lab Results  Component Value Date   HGBA1C 6.1 10/31/2022   Wt Readings from Last 3 Encounters:  05/08/23 223 lb 12.8 oz (101.5 kg)  10/31/22 227 lb 9.6 oz (103.2 kg)  05/03/22 224 lb 3.2 oz (101.7 kg)   Nicotine addiction 1 pack/day smoker.  Cessation has been discussed as well as low-dose CT scan for lung cancer screening.  Has declined lung cancer screening again today, not ready to quit smoking at this time.   Advised to let me know when he is ready and happy to help if assistance needed for cessation.  Health risks discussed with smoking.   Positive colorectal cancer screening with Cologuard Positive on 12/12/2021.  Discussed need for colonoscopy previously quitting at his May visit.  He was aware of possible risks and including delay of diagnosis and treatment options if colon cancer was present.  Declined colonoscopy, gastroenterology referral at his May visit.  Discussed again today - agrees to referral to GI to have procedure this winter. No  unexplained wt loss, night sweats, abd pain, or BRBPR/melena.   History Patient Active Problem List   Diagnosis Date Noted   Lumbar pain 01/28/2018   Sciatica of left side 01/28/2018   Musculoskeletal pain 01/28/2018   Muscle spasm 01/28/2018   Essential hypertension 05/02/2017   Hyperlipidemia 05/02/2017   Prediabetes 05/02/2017   Past Medical History:  Diagnosis Date   Hypertension    Past Surgical History:  Procedure Laterality Date   VASECTOMY     No Known Allergies Prior to Admission medications   Medication Sig Start Date End Date Taking? Authorizing Provider  amLODipine (NORVASC) 10 MG tablet Take 1 tablet (10 mg total) by mouth daily. 10/31/22  Yes Shade Flood, MD  atorvastatin (LIPITOR) 10 MG tablet Take 1 tablet (10 mg total) by mouth daily. 10/31/22  Yes Shade Flood, MD  fish oil-omega-3 fatty acids 1000 MG capsule Take 2 g by mouth daily.   Yes [provider]  glucosamine-chondroitin 500-400 MG tablet Take 1 tablet by mouth 3 (three) times daily.   Yes [provider]  lisinopril-hydrochlorothiazide (ZESTORETIC) 20-12.5 MG tablet Take 2 tablets by mouth daily. 10/31/22  Yes Shade Flood, MD  metoprolol succinate (TOPROL-XL) 50 MG 24 hr tablet Take 1 tablet (50 mg total) by mouth daily. Take with or immediately following a meal. 10/31/22  Yes Shade Flood, MD  Multiple Vitamin (MULTIVITAMIN) tablet  Take 1 tablet by mouth daily.   Yes [provider]   Social History   Socioeconomic History   Marital status: Unknown    Spouse name: Not on file   Number of children: Not on file   Years of education: Not on file   Highest education level: 12th grade  Occupational History   Occupation: Investment banker, corporate: [employer]  Tobacco Use   Smoking status: Every Day    Current packs/day: 1.00    Average packs/day: 1 pack/day for 30.0 years (30.0 ttl pk-yrs)    Types: Cigarettes   Smokeless tobacco: Never  Substance and Sexual Activity   Alcohol use: Yes    Alcohol/week: 12.0 standard drinks of alcohol    Types: 12 Cans of beer per week    Comment: beer   Drug use: No   Sexual activity: Yes    Partners: Female  Other Topics Concern   Not on file  Social History Narrative   Patient walks for exercise.   Social Determinants of Health   Financial Resource Strain: Low Risk  (05/07/2023)   Overall Financial Resource Strain (CARDIA)    Difficulty of Paying Living Expenses: Not hard at all  Food Insecurity: No Food Insecurity (05/07/2023)   Hunger Vital Sign    Worried About Running Out of Food in the Last Year: Never true    Ran Out of Food in the Last Year: Never true  Transportation Needs: No Transportation Needs (05/07/2023)   PRAPARE - Administrator, Civil Service (Medical): No    Lack of Transportation (Non-Medical): No  Physical Activity: Unknown (05/07/2023)   Exercise Vital Sign    Days of Exercise per Week: 0 days    Minutes of Exercise per Session: Not on file  Stress: No Stress Concern Present (05/07/2023)   [employer] of Occupational Health - Occupational Stress Questionnaire    Feeling of Stress : Only a little  Social Connections: Moderately Integrated (05/07/2023)   Social Connection and Isolation Panel [NHANES]    Frequency of Communication with Friends and Family: More than three times a week    Frequency of Social Gatherings with  Friends and Family: Once a week    Attends Religious Services: Never    Database administrator or Organizations: Yes    Attends Banker Meetings: Never    Marital Status: Married  Catering manager Violence: Not on file    Review of Systems  Constitutional:  Negative for fatigue and unexpected weight change.  Eyes:  Negative for visual disturbance.  Respiratory:  Negative for cough, chest tightness and shortness of breath.   Cardiovascular:  Negative for chest pain, palpitations and leg swelling.  Gastrointestinal:  Negative for abdominal pain and blood in stool.  Neurological:  Negative for dizziness, light-headedness and headaches.     Objective:   Vitals:   05/08/23  0824  BP: 122/60  Pulse: 76  Temp: 98.1 F (36.7 C)  TempSrc: Temporal  SpO2: 97%  Weight: 223 lb 12.8 oz (101.5 kg)  Height: 6' (1.829 m)     Physical Exam Vitals reviewed.  Constitutional:      Appearance: He is well-developed.  HENT:     Head: Normocephalic and atraumatic.  Neck:     Vascular: No carotid bruit or JVD.  Cardiovascular:     Rate and Rhythm: Normal rate and regular rhythm.     Heart sounds: Normal heart sounds. No murmur heard. Pulmonary:     Effort: Pulmonary effort is normal.     Breath sounds: Normal breath sounds. No rales.  Musculoskeletal:     Right lower leg: No edema.     Left lower leg: No edema.  Skin:    General: Skin is warm and dry.  Neurological:     Mental Status: He is alert and oriented to person, place, and time.  Psychiatric:        Mood and Affect: Mood normal.        Assessment & Plan:  Jonathan Bush is a 62 y.o. male . Essential hypertension - Plan: amLODipine (NORVASC) 10 MG tablet, lisinopril-hydrochlorothiazide (ZESTORETIC) 20-12.5 MG tablet, metoprolol succinate (TOPROL-XL) 50 MG 24 hr tablet  -Stable on current regimen, check labs, no med changes at this time.  Adjust plan accordingly on lab results.  Hyperlipidemia, unspecified  hyperlipidemia type - Plan: Comprehensive metabolic panel, Lipid panel, atorvastatin (LIPITOR) 10 MG tablet  -Tolerating current dose Lipitor, continue same, check labs.  Prediabetes - Plan: Comprehensive metabolic panel, Hemoglobin A1c  -Diet/exercise approach, commended on healthier eating and weight improvements, exercise during winter, as less golf.  Check labs and adjust plan accordingly.  Positive colorectal cancer screening using Cologuard test - Plan: Ambulatory referral to Gastroenterology  -Agrees to see gastroenterology for colonoscopy in February.  Denies bowel changes, stool changes.  Cigarette nicotine dependence without complication  -Importance of cessation discussed, denies need for assistance at this time.  Meds ordered this encounter  Medications   amLODipine (NORVASC) 10 MG tablet    Sig: Take 1 tablet (10 mg total) by mouth daily.    Dispense:  90 tablet    Refill:  2    DX Code Needed  .   lisinopril-hydrochlorothiazide (ZESTORETIC) 20-12.5 MG tablet    Sig: Take 2 tablets by mouth daily.    Dispense:  180 tablet    Refill:  2   metoprolol succinate (TOPROL-XL) 50 MG 24 hr tablet    Sig: Take 1 tablet (50 mg total) by mouth daily. Take with or immediately following a meal.    Dispense:  90 tablet    Refill:  2    Discontinue 25mg  dose.   atorvastatin (LIPITOR) 10 MG tablet    Sig: Take 1 tablet (10 mg total) by mouth daily.    Dispense:  90 tablet    Refill:  2   Patient Instructions  No med changes at this time.  I did place a referral to gastroenterology, they should be calling you soon.  Keep up the good work with healthier eating, exercise during the off season, and that should help with weight as well as blood pressure control, blood sugar control as well as cholesterol.  Let me know if you need assistance quitting smoking when you are ready.  Let me know if there are questions and take care.    Signed,  [name], MD [facility]  [facility] 05/08/23 9:04 AM

## 2023-05-21 ENCOUNTER — Telehealth: Payer: Self-pay

## 2023-05-21 NOTE — Telephone Encounter (Signed)
Pt called back, gave the message that he can look at MyChart. He said he would do that and if he has any questions he will call back.

## 2023-05-21 NOTE — Telephone Encounter (Signed)
Electrolytes are overall stable.  Sodium level was borderline low, blood sugar borderline high, but not concerning at those readings.  We can recheck that with your next blood work.  Cholesterol levels are also overall stable.  6-month blood sugar test has crept up a bit, now borderline diabetes.  Watch diet, be careful with carbohydrates, starches, low intensity exercise most days per week and lets recheck your levels in 3 months along with the sodium and electrolyte test at that time.  Let me know if you have questions.   Dr. Neva Seat

## 2023-11-12 ENCOUNTER — Telehealth: Payer: Self-pay

## 2023-11-12 ENCOUNTER — Ambulatory Visit: Payer: No Typology Code available for payment source | Admitting: Family Medicine

## 2023-11-12 ENCOUNTER — Encounter: Payer: Self-pay | Admitting: Family Medicine

## 2023-11-12 VITALS — BP 130/72 | HR 96 | Ht 71.4 in | Wt 214.0 lb

## 2023-11-12 DIAGNOSIS — E785 Hyperlipidemia, unspecified: Secondary | ICD-10-CM

## 2023-11-12 DIAGNOSIS — F17210 Nicotine dependence, cigarettes, uncomplicated: Secondary | ICD-10-CM | POA: Diagnosis not present

## 2023-11-12 DIAGNOSIS — Z0000 Encounter for general adult medical examination without abnormal findings: Secondary | ICD-10-CM | POA: Diagnosis not present

## 2023-11-12 DIAGNOSIS — I10 Essential (primary) hypertension: Secondary | ICD-10-CM

## 2023-11-12 DIAGNOSIS — R7303 Prediabetes: Secondary | ICD-10-CM

## 2023-11-12 DIAGNOSIS — Z125 Encounter for screening for malignant neoplasm of prostate: Secondary | ICD-10-CM | POA: Diagnosis not present

## 2023-11-12 DIAGNOSIS — R195 Other fecal abnormalities: Secondary | ICD-10-CM

## 2023-11-12 LAB — HEMOGLOBIN A1C: Hgb A1c MFr Bld: 6.1 % (ref 4.6–6.5)

## 2023-11-12 LAB — COMPREHENSIVE METABOLIC PANEL WITH GFR
ALT: 9 U/L (ref 0–53)
AST: 13 U/L (ref 0–37)
Albumin: 4.3 g/dL (ref 3.5–5.2)
Alkaline Phosphatase: 110 U/L (ref 39–117)
BUN: 11 mg/dL (ref 6–23)
CO2: 28 meq/L (ref 19–32)
Calcium: 9.4 mg/dL (ref 8.4–10.5)
Chloride: 100 meq/L (ref 96–112)
Creatinine, Ser: 0.77 mg/dL (ref 0.40–1.50)
GFR: 96.77 mL/min (ref >60.00)
Glucose, Bld: 109 mg/dL — ABNORMAL HIGH (ref 70–99)
Potassium: 4 meq/L (ref 3.5–5.1)
Sodium: 135 meq/L (ref 135–145)
Total Bilirubin: 0.5 mg/dL (ref 0.2–1.2)
Total Protein: 7.3 g/dL (ref 6.0–8.3)

## 2023-11-12 LAB — LIPID PANEL
Cholesterol: 143 mg/dL (ref 0–200)
HDL: 27.8 mg/dL — ABNORMAL LOW
LDL Cholesterol: 93 mg/dL (ref 0–99)
NonHDL: 114.74
Total CHOL/HDL Ratio: 5
Triglycerides: 109 mg/dL (ref 0.0–149.0)
VLDL: 21.8 mg/dL (ref 0.0–40.0)

## 2023-11-12 LAB — PSA: PSA: 0.42 ng/mL (ref 0.10–4.00)

## 2023-11-12 MED ORDER — LISINOPRIL-HYDROCHLOROTHIAZIDE 20-12.5 MG PO TABS
2.0000 | ORAL_TABLET | Freq: Every day | ORAL | 2 refills | Status: DC
Start: 2023-11-12 — End: 2024-05-07

## 2023-11-12 MED ORDER — ATORVASTATIN CALCIUM 10 MG PO TABS: 10.0000 mg | ORAL_TABLET | Freq: Every day | ORAL | 2 refills | Status: DC

## 2023-11-12 MED ORDER — METOPROLOL SUCCINATE ER 50 MG PO TB24: 50.0000 mg | ORAL_TABLET | Freq: Every day | ORAL | 2 refills | Status: DC

## 2023-11-12 MED ORDER — AMLODIPINE BESYLATE 10 MG PO TABS: 10.0000 mg | ORAL_TABLET | Freq: Every day | ORAL | 2 refills | Status: DC

## 2023-11-12 NOTE — Telephone Encounter (Signed)
 Faxed and emailed physical form for patient. Emailed to [email or web address]. Made copy of form and gave patient original, he is aware it has been sent.

## 2023-11-12 NOTE — Patient Instructions (Signed)
 Thanks for coming in today.  Congratulations on the weight loss.  I expect the labs will look better.  As we discussed I am concerned with the previous positive colon cancer screening test and no follow-up of that test.  I strongly recommend proceeding with colonoscopy, let me know if you change your mind.  If any bowel changes, or new abdominal symptoms be seen right away.  I also recommend quitting smoking -  let me know if we can help when you are ready.  Please let me know if there are any questions and take care!  Preventive Care 26-57 Years Old, Male Preventive care refers to lifestyle choices and visits with your health care provider that can promote health and wellness. Preventive care visits are also called wellness exams. What can I expect for my preventive care visit? Counseling During your preventive care visit, your health care provider may ask about your: Medical history, including: Past medical problems. Family medical history. Current health, including: Emotional well-being. Home life and relationship well-being. Sexual activity. Lifestyle, including: Alcohol, nicotine or tobacco, and drug use. Access to firearms. Diet, exercise, and sleep habits. Safety issues such as seatbelt and bike helmet use. Sunscreen use. Work and work Astronomer. Physical exam Your health care provider will check your: Height and weight. These may be used to calculate your BMI (body mass index). BMI is a measurement that tells if you are at a healthy weight. Waist circumference. This measures the distance around your waistline. This measurement also tells if you are at a healthy weight and may help predict your risk of certain diseases, such as type 2 diabetes and high blood pressure. Heart rate and blood pressure. Body temperature. Skin for abnormal spots. What immunizations do I need?  Vaccines are usually given at various ages, according to a schedule. Your health care provider will  recommend vaccines for you based on your age, medical history, and lifestyle or other factors, such as travel or where you work. What tests do I need? Screening Your health care provider may recommend screening tests for certain conditions. This may include: Lipid and cholesterol levels. Diabetes screening. This is done by checking your blood sugar (glucose) after you have not eaten for a while (fasting). Hepatitis B test. Hepatitis C test. HIV (human immunodeficiency virus) test. STI (sexually transmitted infection) testing, if you are at risk. Lung cancer screening. Prostate cancer screening. Colorectal cancer screening. Talk with your health care provider about your test results, treatment options, and if necessary, the need for more tests. Follow these instructions at home: Eating and drinking  Eat a diet that includes fresh fruits and vegetables, whole grains, lean protein, and low-fat dairy products. Take vitamin and mineral supplements as recommended by your health care provider. Do not drink alcohol if your health care provider tells you not to drink. If you drink alcohol: Limit how much you have to 0-2 drinks a day. Know how much alcohol is in your drink. In the [place] one drink equals one 12 oz bottle of beer (355 mL), one 5 oz glass of wine (148 mL), or one 1 oz glass of hard liquor (44 mL). Lifestyle Brush your teeth every morning and night with fluoride toothpaste. Floss one time each day. Exercise for at least 30 minutes 5 or more days each week. Do not use any products that contain nicotine or tobacco. These products include cigarettes, chewing tobacco, and vaping devices, such as e-cigarettes. If you need help quitting, ask your health care provider.  Do not use drugs. If you are sexually active, practice safe sex. Use a condom or other form of protection to prevent STIs. Take aspirin only as told by your health care provider. Make sure that you understand how much to take  and what form to take. Work with your health care provider to find out whether it is safe and beneficial for you to take aspirin daily. Find healthy ways to manage stress, such as: Meditation, yoga, or listening to music. Journaling. Talking to a trusted person. Spending time with friends and family. Minimize exposure to UV radiation to reduce your risk of skin cancer. Safety Always wear your seat belt while driving or riding in a vehicle. Do not drive: If you have been drinking alcohol. Do not ride with someone who has been drinking. When you are tired or distracted. While texting. If you have been using any mind-altering substances or drugs. Wear a helmet and other protective equipment during sports activities. If you have firearms in your house, make sure you follow all gun safety procedures. What's next? Go to your health care provider once a year for an annual wellness visit. Ask your health care provider how often you should have your eyes and teeth checked. Stay up to date on all vaccines. This information is not intended to replace advice given to you by your health care provider. Make sure you discuss any questions you have with your health care provider. Document Revised: 11/29/2020 Document Reviewed: 11/29/2020 Elsevier Patient Education  2024 [employer].

## 2023-11-12 NOTE — Progress Notes (Signed)
 Subjective:  Patient ID: Jonathan Bush, male    DOB: 1960-10-02  Age: 63 y.o. MRN: [id number]  CC:  Chief Complaint  Patient presents with   Annual Exam    No concerns     HPI Jonathan Bush presents for Annual Exam  Hypertension: Amlodipine  10mg  every day, lisinopril  hct total dose 40/25mg  every day, Toprol -XL 50 mg daily, no new side effects.  Home readings:none BP Readings from Last 3 Encounters:  11/12/23 130/72  05/08/23 122/60  10/31/22 (!) 140/64   Lab Results  Component Value Date   CREATININE 0.80 05/08/2023   Hyperlipidemia: Lipitor 10 mg daily. No new myalgias/side effects. Prior leg soreness resolved.  Lab Results  Component Value Date   CHOL 129 05/08/2023   HDL 25.20 (L) 05/08/2023   LDLCALC 83 05/08/2023   TRIG 107.0 05/08/2023   CHOLHDL 5 05/08/2023   Lab Results  Component Value Date   ALT 10 05/08/2023   AST 15 05/08/2023   ALKPHOS 107 05/08/2023   BILITOT 0.5 05/08/2023   Prediabetes: Diet/exercise approach.  At upper end of prediabetes range in November of last year.  Weight has improved since that time, down 9 pounds today. More active and more aware of what he is eating.   Nicotine addiction 1 pack/day smoker, cessation discussed previously as well as low-dose CT scan for lung cancer screening.  Not ready to quit smoking, aware of health risks.  Also declines lung cancer screening at this time.  Positive colorectal screening Positive Cologuard 12/12/2021.  We discussed need for colonoscopy previously, agreed to have GI eval last winter.  Referral was placed 05/08/2023.  Multiple messages left from gastroenterology to patient to schedule visit.  Was not done. Declines further testing at this time. Aware of possible untreated colon cancer and delay of treatment if that were present.   Lab Results  Component Value Date   HGBA1C 6.4 05/08/2023   Wt Readings from Last 3 Encounters:  11/12/23 214 lb (97.1 kg)  05/08/23 223 lb 12.8 oz (101.5  kg)  10/31/22 227 lb 9.6 oz (103.2 kg)       05/08/2023    8:25 AM 10/31/2022    8:51 AM 05/03/2022    8:12 AM 10/19/2021    9:26 AM 10/18/2020    8:04 AM  Depression screen PHQ 2/9  Decreased Interest 0 0 0 0 0  Down, Depressed, Hopeless 0 0 0 0 0  PHQ - 2 Score 0 0 0 0 0  Altered sleeping 0 0 0 0   Tired, decreased energy 0 0 0 0   Change in appetite 0 0 0 0   Feeling bad or failure about yourself  0 0 0 0   Trouble concentrating 0 0 0 0   Moving slowly or fidgety/restless 0 0 0 0   Suicidal thoughts 0 0 0 0   PHQ-9 Score 0 0 0 0   Difficult doing work/chores  Not difficult at all  Not difficult at all     Health Maintenance  Topic Date Due   Pneumococcal Vaccine 84-8 Years old (1 of 2 - PCV) Never done   Colonoscopy  Never done   Zoster Vaccines- Shingrix (1 of 2) Never done   COVID-19 Vaccine (4 - 2024-25 season) 02/16/2023   Lung Cancer Screening  05/07/2024 (Originally 07/27/2012)   DTaP/Tdap/Td (2 - Td or Tdap) 12/31/2027   Hepatitis C Screening  Completed   HIV Screening  Completed   HPV VACCINES  Aged Out   Meningococcal B Vaccine  Aged Out   INFLUENZA VACCINE  Discontinued  Colon CA screening -declines as above.  Prostate: does not have family history of prostate cancer The natural history of prostate cancer and ongoing controversy regarding screening and potential treatment outcomes of prostate cancer has been discussed with the patient. The meaning of a false positive PSA and a false negative PSA has been discussed. He indicates understanding of the limitations of this screening test and wishes to proceed with screening PSA testing. Lab Results  Component Value Date   PSA1 0.5 12/30/2017   PSA 0.43 10/19/2021   PSA 0.50 10/18/2020      Immunization History  Administered Date(s) Administered   Moderna Sars-Covid-2 Vaccination 08/31/2019, 09/28/2019, 05/30/2020   Tdap 12/30/2017  Declines all vaccines at this time.   No results found. Optho every few  years.   Dental: every 6 months.   Alcohol: cut back - 6 beers per week.   Tobacco: 1 pack per day as above.   Exercise: golf 1-2 days per week, and mowing grass 2 times per week, yardwork.   History Patient Active Problem List   Diagnosis Date Noted   Lumbar pain 01/28/2018   Sciatica of left side 01/28/2018   Musculoskeletal pain 01/28/2018   Muscle spasm 01/28/2018   Essential hypertension 05/02/2017   Hyperlipidemia 05/02/2017   Prediabetes 05/02/2017   Past Medical History:  Diagnosis Date   Hypertension    Past Surgical History:  Procedure Laterality Date   VASECTOMY     No Known Allergies Prior to Admission medications   Medication Sig Start Date End Date Taking? Authorizing Provider  amLODipine  (NORVASC ) 10 MG tablet Take 1 tablet (10 mg total) by mouth daily. 05/08/23  Yes Benjiman Bras, MD  atorvastatin  (LIPITOR) 10 MG tablet Take 1 tablet (10 mg total) by mouth daily. 05/08/23  Yes Benjiman Bras, MD  fish oil-omega-3 fatty acids 1000 MG capsule Take 2 g by mouth daily.   Yes [provider]  glucosamine-chondroitin 500-400 MG tablet Take 1 tablet by mouth 3 (three) times daily.   Yes [provider]  lisinopril -hydrochlorothiazide  (ZESTORETIC ) 20-12.5 MG tablet Take 2 tablets by mouth daily. 05/08/23  Yes Benjiman Bras, MD  metoprolol  succinate (TOPROL -XL) 50 MG 24 hr tablet Take 1 tablet (50 mg total) by mouth daily. Take with or immediately following a meal. 05/08/23  Yes Benjiman Bras, MD  Multiple Vitamin (MULTIVITAMIN) tablet Take 1 tablet by mouth daily.   Yes [provider]   Social History   Socioeconomic History   Marital status: Unknown    Spouse name: Not on file   Number of children: Not on file   Years of education: Not on file   Highest education level: 12th grade  Occupational History   Occupation: Investment banker, corporate: [employer]  Tobacco Use   Smoking status: Every Day    Current packs/day:  1.00    Average packs/day: 1 pack/day for 30.0 years (30.0 ttl pk-yrs)    Types: Cigarettes   Smokeless tobacco: Never  Substance and Sexual Activity   Alcohol use: Yes    Alcohol/week: 6.0 standard drinks of alcohol    Types: 6 Cans of beer per week    Comment: beer   Drug use: No   Sexual activity: Yes    Partners: Female  Other Topics Concern   Not on file  Social History Narrative   Patient walks for  exercise.   Social Drivers of Corporate investment banker Strain: Low Risk  (11/11/2023)   Overall Financial Resource Strain (CARDIA)    Difficulty of Paying Living Expenses: Not hard at all  Food Insecurity: No Food Insecurity (11/11/2023)   Hunger Vital Sign    Worried About Running Out of Food in the Last Year: Never true    Ran Out of Food in the Last Year: Never true  Transportation Needs: No Transportation Needs (11/11/2023)   PRAPARE - Administrator, Civil Service (Medical): No    Lack of Transportation (Non-Medical): No  Physical Activity: Unknown (11/11/2023)   Exercise Vital Sign    Days of Exercise per Week: 0 days    Minutes of Exercise per Session: Not on file  Stress: No Stress Concern Present (11/11/2023)   [employer] of Occupational Health - Occupational Stress Questionnaire    Feeling of Stress : Not at all  Social Connections: Moderately Integrated (11/11/2023)   Social Connection and Isolation Panel [NHANES]    Frequency of Communication with Friends and Family: Twice a week    Frequency of Social Gatherings with Friends and Family: Once a week    Attends Religious Services: Never    Database administrator or Organizations: Yes    Attends Banker Meetings: Never    Marital Status: Married  Catering manager Violence: Not on file    Review of Systems  Constitutional:  Negative for fatigue and unexpected weight change.  Eyes:  Negative for visual disturbance.  Respiratory:  Negative for cough, chest tightness and shortness  of breath.   Cardiovascular:  Negative for chest pain, palpitations and leg swelling.  Gastrointestinal:  Negative for abdominal pain, anal bleeding and blood in stool.       No bowel changes.   Neurological:  Negative for dizziness, light-headedness and headaches.    13 point review of systems per patient health survey noted.  Negative other than as indicated above or in HPI.   Objective:   Vitals:   11/12/23 0757  BP: 130/72  Pulse: 96  SpO2: 98%  Weight: 214 lb (97.1 kg)  Height: 5' 11.4" (1.814 m)     Physical Exam Vitals reviewed.  Constitutional:      Appearance: He is well-developed.  HENT:     Head: Normocephalic and atraumatic.     Right Ear: External ear normal.     Left Ear: External ear normal.  Eyes:     Conjunctiva/sclera: Conjunctivae normal.     Pupils: Pupils are equal, round, and reactive to light.  Neck:     Thyroid: No thyromegaly.  Cardiovascular:     Rate and Rhythm: Normal rate and regular rhythm.     Heart sounds: Normal heart sounds.  Pulmonary:     Effort: Pulmonary effort is normal. No respiratory distress.     Breath sounds: Normal breath sounds. No wheezing.  Abdominal:     General: Bowel sounds are normal. There is no distension.     Palpations: Abdomen is soft. There is no mass.     Tenderness: There is no abdominal tenderness. There is no guarding.  Musculoskeletal:        General: No tenderness. Normal range of motion.     Cervical back: Normal range of motion and neck supple.  Lymphadenopathy:     Cervical: No cervical adenopathy.  Skin:    General: Skin is warm and dry.  Neurological:     Mental  Status: He is alert and oriented to person, place, and time.     Deep Tendon Reflexes: Reflexes are normal and symmetric.  Psychiatric:        Behavior: Behavior normal.        Assessment & Plan:  Jonathan Bush is a 63 y.o. male . Annual physical exam   --anticipatory guidance as below in AVS, screening labs above. Health  maintenance items as above in HPI discussed/recommended as applicable.   Essential hypertension - Plan: Comprehensive metabolic panel with GFR, amLODipine  (NORVASC ) 10 MG tablet, lisinopril -hydrochlorothiazide  (ZESTORETIC ) 20-12.5 MG tablet, metoprolol  succinate (TOPROL -XL) 50 MG 24 hr tablet -  Stable, tolerating current regimen. Medications refilled. Labs pending as above.   Hyperlipidemia, unspecified hyperlipidemia type - Plan: Comprehensive metabolic panel with GFR, Lipid panel, atorvastatin  (LIPITOR) 10 MG tablet  -  Stable, tolerating current regimen. Medications refilled. Labs pending as above.   Cigarette nicotine dependence without complication  - Declines cessation at this time, health risk discussed, offered assistance if needed when he is ready to quit.  He declines lung cancer screening.  Positive colorectal cancer screening using Cologuard test  - Strongly recommended follow-up testing, colonoscopy which he declines.  He is aware of possible risks of not proceeding with this testing including possible underlying colon cancer, delay in treatment and possible progression of disease if a cancer is present.  Prediabetes - Plan: Hemoglobin A1c  - Commended on weight loss and positive health changes, check updated A1c.  Continue diet/exercise approach.  Screening for malignant neoplasm of prostate - Plan: PSA   Meds ordered this encounter  Medications   amLODipine  (NORVASC ) 10 MG tablet    Sig: Take 1 tablet (10 mg total) by mouth daily.    Dispense:  90 tablet    Refill:  2    DX Code Needed  .   atorvastatin  (LIPITOR) 10 MG tablet    Sig: Take 1 tablet (10 mg total) by mouth daily.    Dispense:  90 tablet    Refill:  2   lisinopril -hydrochlorothiazide  (ZESTORETIC ) 20-12.5 MG tablet    Sig: Take 2 tablets by mouth daily.    Dispense:  180 tablet    Refill:  2   metoprolol  succinate (TOPROL -XL) 50 MG 24 hr tablet    Sig: Take 1 tablet (50 mg total) by mouth daily. Take  with or immediately following a meal.    Dispense:  90 tablet    Refill:  2    Discontinue 25mg  dose.   Patient Instructions  Thanks for coming in today.  Congratulations on the weight loss.  I expect the labs will look better.  As we discussed I am concerned with the previous positive colon cancer screening test and no follow-up of that test.  I strongly recommend proceeding with colonoscopy, let me know if you change your mind.  If any bowel changes, or new abdominal symptoms be seen right away.  I also recommend quitting smoking -  let me know if we can help when you are ready.  Please let me know if there are any questions and take care!  Preventive Care 71-19 Years Old, Male Preventive care refers to lifestyle choices and visits with your health care provider that can promote health and wellness. Preventive care visits are also called wellness exams. What can I expect for my preventive care visit? Counseling During your preventive care visit, your health care provider may ask about your: Medical history, including: Past medical problems. Family  medical history. Current health, including: Emotional well-being. Home life and relationship well-being. Sexual activity. Lifestyle, including: Alcohol, nicotine or tobacco, and drug use. Access to firearms. Diet, exercise, and sleep habits. Safety issues such as seatbelt and bike helmet use. Sunscreen use. Work and work Astronomer. Physical exam Your health care provider will check your: Height and weight. These may be used to calculate your BMI (body mass index). BMI is a measurement that tells if you are at a healthy weight. Waist circumference. This measures the distance around your waistline. This measurement also tells if you are at a healthy weight and may help predict your risk of certain diseases, such as type 2 diabetes and high blood pressure. Heart rate and blood pressure. Body temperature. Skin for abnormal spots. What  immunizations do I need?  Vaccines are usually given at various ages, according to a schedule. Your health care provider will recommend vaccines for you based on your age, medical history, and lifestyle or other factors, such as travel or where you work. What tests do I need? Screening Your health care provider may recommend screening tests for certain conditions. This may include: Lipid and cholesterol levels. Diabetes screening. This is done by checking your blood sugar (glucose) after you have not eaten for a while (fasting). Hepatitis B test. Hepatitis C test. HIV (human immunodeficiency virus) test. STI (sexually transmitted infection) testing, if you are at risk. Lung cancer screening. Prostate cancer screening. Colorectal cancer screening. Talk with your health care provider about your test results, treatment options, and if necessary, the need for more tests. Follow these instructions at home: Eating and drinking  Eat a diet that includes fresh fruits and vegetables, whole grains, lean protein, and low-fat dairy products. Take vitamin and mineral supplements as recommended by your health care provider. Do not drink alcohol if your health care provider tells you not to drink. If you drink alcohol: Limit how much you have to 0-2 drinks a day. Know how much alcohol is in your drink. In the [place] one drink equals one 12 oz bottle of beer (355 mL), one 5 oz glass of wine (148 mL), or one 1 oz glass of hard liquor (44 mL). Lifestyle Brush your teeth every morning and night with fluoride toothpaste. Floss one time each day. Exercise for at least 30 minutes 5 or more days each week. Do not use any products that contain nicotine or tobacco. These products include cigarettes, chewing tobacco, and vaping devices, such as e-cigarettes. If you need help quitting, ask your health care provider. Do not use drugs. If you are sexually active, practice safe sex. Use a condom or other form of  protection to prevent STIs. Take aspirin only as told by your health care provider. Make sure that you understand how much to take and what form to take. Work with your health care provider to find out whether it is safe and beneficial for you to take aspirin daily. Find healthy ways to manage stress, such as: Meditation, yoga, or listening to music. Journaling. Talking to a trusted person. Spending time with friends and family. Minimize exposure to UV radiation to reduce your risk of skin cancer. Safety Always wear your seat belt while driving or riding in a vehicle. Do not drive: If you have been drinking alcohol. Do not ride with someone who has been drinking. When you are tired or distracted. While texting. If you have been using any mind-altering substances or drugs. Wear a helmet and other protective equipment during  sports activities. If you have firearms in your house, make sure you follow all gun safety procedures. What's next? Go to your health care provider once a year for an annual wellness visit. Ask your health care provider how often you should have your eyes and teeth checked. Stay up to date on all vaccines. This information is not intended to replace advice given to you by your health care provider. Make sure you discuss any questions you have with your health care provider. Document Revised: 11/29/2020 Document Reviewed: 11/29/2020 Elsevier Patient Education  2024 [employer]    Signed,   Caro Christmas, MD [facility], [facility] 11/12/23 8:33 AM

## 2023-11-18 ENCOUNTER — Ambulatory Visit: Payer: Self-pay | Admitting: Family Medicine

## 2024-05-07 ENCOUNTER — Ambulatory Visit (INDEPENDENT_AMBULATORY_CARE_PROVIDER_SITE_OTHER): Admitting: Family Medicine

## 2024-05-07 ENCOUNTER — Encounter: Payer: Self-pay | Admitting: Family Medicine

## 2024-05-07 VITALS — BP 132/60 | HR 87 | Temp 98.1°F | Ht 72.5 in | Wt 207.2 lb

## 2024-05-07 DIAGNOSIS — F17210 Nicotine dependence, cigarettes, uncomplicated: Secondary | ICD-10-CM

## 2024-05-07 DIAGNOSIS — M7501 Adhesive capsulitis of right shoulder: Secondary | ICD-10-CM

## 2024-05-07 DIAGNOSIS — R195 Other fecal abnormalities: Secondary | ICD-10-CM

## 2024-05-07 DIAGNOSIS — E785 Hyperlipidemia, unspecified: Secondary | ICD-10-CM

## 2024-05-07 DIAGNOSIS — M25511 Pain in right shoulder: Secondary | ICD-10-CM | POA: Diagnosis not present

## 2024-05-07 DIAGNOSIS — R7303 Prediabetes: Secondary | ICD-10-CM

## 2024-05-07 DIAGNOSIS — I10 Essential (primary) hypertension: Secondary | ICD-10-CM | POA: Diagnosis not present

## 2024-05-07 DIAGNOSIS — H6122 Impacted cerumen, left ear: Secondary | ICD-10-CM | POA: Diagnosis not present

## 2024-05-07 LAB — LIPID PANEL
Cholesterol: 130 mg/dL (ref 0–200)
HDL: 25.6 mg/dL — ABNORMAL LOW (ref >39.00)
LDL Cholesterol: 78 mg/dL (ref 0–99)
NonHDL: 104.11
Total CHOL/HDL Ratio: 5
Triglycerides: 130 mg/dL (ref 0.0–149.0)
VLDL: 26 mg/dL (ref 0.0–40.0)

## 2024-05-07 LAB — COMPREHENSIVE METABOLIC PANEL WITH GFR
ALT: 5 U/L (ref 0–53)
AST: 10 U/L (ref 0–37)
Albumin: 4.3 g/dL (ref 3.5–5.2)
Alkaline Phosphatase: 103 U/L (ref 39–117)
BUN: 11 mg/dL (ref 6–23)
CO2: 30 meq/L (ref 19–32)
Calcium: 9.4 mg/dL (ref 8.4–10.5)
Chloride: 98 meq/L (ref 96–112)
Creatinine, Ser: 0.75 mg/dL (ref 0.40–1.50)
GFR: 97.21 mL/min (ref >60.00)
Glucose, Bld: 108 mg/dL — ABNORMAL HIGH (ref 70–99)
Potassium: 4.1 meq/L (ref 3.5–5.1)
Sodium: 134 meq/L — ABNORMAL LOW (ref 135–145)
Total Bilirubin: 0.6 mg/dL (ref 0.2–1.2)
Total Protein: 7.4 g/dL (ref 6.0–8.3)

## 2024-05-07 LAB — HEMOGLOBIN A1C: Hgb A1c MFr Bld: 6 % (ref 4.6–6.5)

## 2024-05-07 MED ORDER — ATORVASTATIN CALCIUM 10 MG PO TABS: 10.0000 mg | ORAL_TABLET | Freq: Every day | ORAL | 2 refills | Status: AC

## 2024-05-07 MED ORDER — AMLODIPINE BESYLATE 10 MG PO TABS: 10.0000 mg | ORAL_TABLET | Freq: Every day | ORAL | 2 refills | Status: AC

## 2024-05-07 MED ORDER — LISINOPRIL-HYDROCHLOROTHIAZIDE 20-12.5 MG PO TABS: 2.0000 | ORAL_TABLET | Freq: Every day | ORAL | 2 refills | Status: AC

## 2024-05-07 MED ORDER — METOPROLOL SUCCINATE ER 50 MG PO TB24: 50.0000 mg | ORAL_TABLET | Freq: Every day | ORAL | 2 refills | Status: AC

## 2024-05-07 NOTE — Patient Instructions (Signed)
 I will refer you to orthopedics for suspected frozen shoulder. I have referred you to gastroenterology for follow-up of the positive colorectal cancer screening and need for colonoscopy.  Let me know if you need any assistance with quitting smoking and let me know if you change your mind on lung cancer screening and I am happy to order that testing. If any concerns on labs I will let you know.  No med changes at this time.  Thanks for coming in today and take care.  Adhesive Capsulitis  Adhesive capsulitis, also called frozen shoulder, causes the shoulder to become stiff and painful to move. This condition happens when there is inflammation of the tendons and ligaments that surround the shoulder joint (shoulder capsule). Tendons are tissues that connect muscle to bone. Ligaments are tissues that connect bones to each other. What are the causes? This condition may be caused by: An injury to your shoulder joint. Straining your shoulder. Not moving your shoulder for a period of time. This can happen if your arm was injured or in a sling. In some cases, the cause is not known. What increases the risk? You are more likely to develop this condition if: You are male. You are older than 61 years of age. You have certain other conditions, such as: Diabetes. Thyroid problems. Stroke. You recently had surgery, especially shoulder or neck surgery. What are the signs or symptoms? Symptoms of this condition include: Pain in your shoulder when you move your arm. There may also be pain when parts of your shoulder are touched. The pain may be worse at night or when you are resting. Not being able to move your shoulder normally. Sudden muscle tightening (muscle spasms). How is this diagnosed? This condition is diagnosed with a physical exam and imaging tests, such as an X-ray or MRI. How is this treated? This condition may be treated with: Treatment of the injury or condition that caused the adhesive  capsulitis. Medicines for pain, inflammation, or muscle spasms. Injections of medicine (steroids) into the shoulder joint. Physical therapy. This involves doing exercises to get the shoulder moving again. Shoulder manipulation. This is a procedure to move the shoulder into another position. It is done after you are given a medicine to make you fall asleep (general anesthesia). The joint may also be injected with salt water at high pressure to break down scarring. Surgery. This may be done in severe cases when other treatments do not work. Some less common treatments include: Injection of hyaluronic acid into the shoulder joint. This substance is a normal part of the fluid inside joints. It helps lubricate the joint and can lower inflammation. Injection of platelet-rich plasma into the shoulder joint. This uses a type of blood cell from your own blood that may speed up the healing process. Sending energy waves to the affected area (extracorporeal shock wave therapy). Most people recover completely from adhesive capsulitis, but some may not get back full shoulder movement. Follow these instructions at home: Managing pain, stiffness, and swelling     If told, put ice on the injured area. Put ice in a plastic bag. Place a towel between your skin and the bag. Leave the ice on for 20 minutes, 2-3 times a day. If told, apply heat to the affected area before you exercise. Use the heat source that your health care provider recommends, such as a moist heat pack or a heating pad. Place a towel between your skin and the heat source. Leave the heat on  for 20-30 minutes. If your skin turns bright red, remove the ice or heat right away to prevent skin damage. The risk of damage is higher if you cannot feel pain, heat, or cold. General instructions Take over-the-counter and prescription medicines only as told by your provider. If you are being treated with physical therapy, do exercises as told. Avoid  activities or exercises that put a lot of demand on your shoulder, such as throwing. These can make pain worse. Contact a health care provider if: You have new symptoms. Your symptoms get worse. This information is not intended to replace advice given to you by your health care provider. Make sure you discuss any questions you have with your health care provider. Document Revised: 03/18/2022 Document Reviewed: 03/18/2022 Elsevier Patient Education  2024 [employer].

## 2024-05-07 NOTE — Progress Notes (Signed)
 follo

## 2024-05-07 NOTE — Progress Notes (Signed)
 Subjective:  Patient ID: Jonathan Bush, male    DOB: Nov 20, 1960  Age: 63 y.o. MRN: [id number]  CC:  Chief Complaint  Patient presents with   Follow-up    6 month f/u plus right shoulder. Is not sure if he has rotator cuff issues or not. Just randomly started happening about three months ago.     HPI [name] presents for scheduled chronic condition follow-up as well as acute issue as above  Right shoulder pain Pain past 3 months.  No known injury. R hand dominant. No new activities. 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 Normal swing is ok, but when trying to cut under a shot notes pain on outside of shoulder. Some decreased motion.  Tx: none, other than heat - some improvement.  No prior surgery/injections.  Has not been able to golf since October.   Hypertension: Amlodipine  10 mg daily, lisinopril  HCTZ total dose of 40/25 mg daily, Toprol -XL 50 mg daily without any medication side effects.  Home readings: none BP Readings from Last 3 Encounters:  05/07/24 132/60  11/12/23 130/72  05/08/23 122/60   Lab Results  Component Value Date   CREATININE 0.77 11/12/2023   Hyperlipidemia: Lipitor 10 mg daily without any myalgias or side effects other than shoulder pain as above. Lab Results  Component Value Date   CHOL 143 11/12/2023   HDL 27.80 (L) 11/12/2023   LDLCALC 93 11/12/2023   TRIG 109.0 11/12/2023   CHOLHDL 5 11/12/2023   Lab Results  Component Value Date   ALT 9 11/12/2023   AST 13 11/12/2023   ALKPHOS 110 11/12/2023   BILITOT 0.5 11/12/2023   Prediabetes: Diet/exercise approach.  Weight has improved.has been trying to lose weight - cut back on portions.  Lab Results  Component Value Date   HGBA1C 6.1 11/12/2023   Wt Readings from Last 3 Encounters:  05/07/24 207 lb 3.2 oz (94 kg)  11/12/23 214 lb (97.1 kg)  05/08/23 223 lb 12.8 oz (101.5 kg)   Nicotine addiction Cessation has been discussed, declined assistance options.  Has also declined  lung cancer screening.  Denies new cough, hemoptysis, or unexplained weight loss. Still smoking 1 ppd. Considering quitting 1st of year. Declines assistance or meds.   Positive Cologuard, colorectal screening Positive Cologuard in June 2023, and we discussed need for colonoscopy as with positive test differential includes underlying colon cancer.  He had been referred to gastroenterology, multiple messages have been left, but he ultimately decided against any further testing.  We discussed possible risks of underlying colon cancer and risks of delay of treatment if that were present.  Understanding of these risks have been expressed.  Denies any abdominal pain, melena or hematochezia or unexplained weight loss. No change in stools.  Agrees to see GI after the first of the year.   Health maintenance Declines COVID booster, lung cancer screening, shingles vaccine or pneumonia vaccine.  At conclusion of visit, noted additional concern.  Left ear has field block, echo.  Has had history of cerumen impaction.  He requests cerumen lavage today.  Verbal consent obtained after discussion of potential risks and benefits of cerumen lavage including but not limited to TM perforation, damage to canal, infection although unlikely.  Benefits and alternatives were discussed and again consent obtained as above.   History Patient Active Problem List   Diagnosis Date Noted   Lumbar pain 01/28/2018   Sciatica of left side 01/28/2018   Musculoskeletal pain 01/28/2018  Muscle spasm 01/28/2018   Essential hypertension 05/02/2017   Hyperlipidemia 05/02/2017   Prediabetes 05/02/2017   Past Medical History:  Diagnosis Date   Hypertension    Past Surgical History:  Procedure Laterality Date   VASECTOMY     No Known Allergies Prior to Admission medications   Medication Sig Start Date End Date Taking? Authorizing Provider  amLODipine  (NORVASC ) 10 MG tablet Take 1 tablet (10 mg total) by mouth daily. 11/12/23   Yes Levora Reyes SAUNDERS, MD  atorvastatin  (LIPITOR) 10 MG tablet Take 1 tablet (10 mg total) by mouth daily. 11/12/23  Yes Levora Reyes SAUNDERS, MD  fish oil-omega-3 fatty acids 1000 MG capsule Take 2 g by mouth daily.   Yes [provider]  glucosamine-chondroitin 500-400 MG tablet Take 1 tablet by mouth 3 (three) times daily.   Yes [provider]  lisinopril -hydrochlorothiazide  (ZESTORETIC ) 20-12.5 MG tablet Take 2 tablets by mouth daily. 11/12/23  Yes Levora Reyes SAUNDERS, MD  metoprolol  succinate (TOPROL -XL) 50 MG 24 hr tablet Take 1 tablet (50 mg total) by mouth daily. Take with or immediately following a meal. 11/12/23  Yes Levora Reyes SAUNDERS, MD  Multiple Vitamin (MULTIVITAMIN) tablet Take 1 tablet by mouth daily.   Yes [provider]   Social History   Socioeconomic History   Marital status: Unknown    Spouse name: Not on file   Number of children: Not on file   Years of education: Not on file   Highest education level: 12th grade  Occupational History   Occupation: Investment Banker, Corporate: [employer]  Tobacco Use   Smoking status: Every Day    Current packs/day: 1.00    Average packs/day: 1 pack/day for 30.0 years (30.0 ttl pk-yrs)    Types: Cigarettes   Smokeless tobacco: Never  Substance and Sexual Activity   Alcohol use: Yes    Alcohol/week: 6.0 standard drinks of alcohol    Types: 6 Cans of beer per week    Comment: beer   Drug use: No   Sexual activity: Yes    Partners: Female  Other Topics Concern   Not on file  Social History Narrative   Patient walks for exercise.   Social Drivers of Corporate Investment Banker Strain: Low Risk  (05/03/2024)   Overall Financial Resource Strain (CARDIA)    Difficulty of Paying Living Expenses: Not hard at all  Food Insecurity: No Food Insecurity (05/03/2024)   Hunger Vital Sign    Worried About Running Out of Food in the Last Year: Never true    Ran Out of Food in the Last Year: Never true  Transportation  Needs: No Transportation Needs (05/03/2024)   PRAPARE - Administrator, Civil Service (Medical): No    Lack of Transportation (Non-Medical): No  Physical Activity: Inactive (05/03/2024)   Exercise Vital Sign    Days of Exercise per Week: 0 days    Minutes of Exercise per Session: Not on file  Stress: No Stress Concern Present (05/03/2024)   [employer] of Occupational Health - Occupational Stress Questionnaire    Feeling of Stress: Not at all  Social Connections: Moderately Isolated (05/03/2024)   Social Connection and Isolation Panel    Frequency of Communication with Friends and Family: More than three times a week    Frequency of Social Gatherings with Friends and Family: Twice a week    Attends Religious Services: Never    Database Administrator or Organizations: No  Attends Banker Meetings: Not on file    Marital Status: Married  Catering Manager Violence: Not on file    Review of Systems  Constitutional:  Negative for fatigue and unexpected weight change.  Eyes:  Negative for visual disturbance.  Respiratory:  Negative for cough, chest tightness and shortness of breath.   Cardiovascular:  Negative for chest pain, palpitations and leg swelling.  Gastrointestinal:  Negative for abdominal pain and blood in stool.  Neurological:  Negative for dizziness, light-headedness and headaches.     Objective:   Vitals:   05/07/24 0832  BP: 132/60  Pulse: 87  Temp: 98.1 F (36.7 C)  TempSrc: Oral  SpO2: 98%  Weight: 207 lb 3.2 oz (94 kg)  Height: 6' 0.5 (1.842 m)     Physical Exam Vitals reviewed.  Constitutional:      Appearance: He is well-developed.  HENT:     Head: Normocephalic and atraumatic.     Left Ear: There is impacted cerumen.     Ears:     Comments: Thick dark cerumen obstructing left canal.  Right canal clear. Neck:     Vascular: No carotid bruit or JVD.  Cardiovascular:     Rate and Rhythm: Normal rate and regular  rhythm.     Heart sounds: Normal heart sounds. No murmur heard. Pulmonary:     Effort: Pulmonary effort is normal.     Breath sounds: Normal breath sounds. No rales.  Musculoskeletal:     Right lower leg: No edema.     Left lower leg: No edema.     Comments: C-spine, pain-free range of motion, no midline bony tenderness. Clavicle Coos, AC nontender. No focal bony tenderness of right shoulder. Passive range of motion equals active range of motion which is limited to approximately 90 degrees of flexion, abduction and internal rotation only to SI joint, diminished compared to his left side.  Discomfort with Neer's at 80-90 agrees, but I am unable to obtain any further range of motion even with passive ROM.  Intact RTC strength with negative empty can.  Skin:    General: Skin is warm and dry.  Neurological:     Mental Status: He is alert and oriented to person, place, and time.  Psychiatric:        Mood and Affect: Mood normal.     Cerumen lavage of the left ear, with consent as above, performed by CMA.  Successful with removal of cerumen.  Exam after procedure without canal edema, erythema, TM intact, no complications.  Feels well and hearing well.   Assessment & Plan:  MAXIMO SPRATLING is a 63 y.o. male . Right shoulder pain, unspecified chronicity - Plan: Ambulatory referral to Orthopedic Surgery Adhesive capsulitis of right shoulder - Plan: Ambulatory referral to Orthopedic Surgery  - Progressive pain of right shoulder, now with significant decreased motion, including with passive motion.  Suspected adhesive capsulitis without known injury.  He has cut back on golfing.  Will refer to Ortho as I anticipate injection, physical therapy may be needed but can discuss treatment plan with orthopedics.  Handout given.  Without known injury or acute trauma, imaging deferred at this time.  Impacted cerumen of left ear - Plan: Ear wax removal  - Treated as above with lavage, successful, no  complications.  RTC precautions  Positive colorectal cancer screening using Cologuard test - Plan: Ambulatory referral to Gastroenterology  - He does agree to have colonoscopy at this time after the first of  the year.  Referral placed.  No concerning symptoms on history above.  Cigarette nicotine dependence without complication  - Plans to quit first of the year.  Declines any assistance options or meds at this time.  Declines lung cancer screening.  Essential hypertension - Plan: amLODipine  (NORVASC ) 10 MG tablet, lisinopril -hydrochlorothiazide  (ZESTORETIC ) 20-12.5 MG tablet, metoprolol  succinate (TOPROL -XL) 50 MG 24 hr tablet, Comprehensive metabolic panel with GFR  -  Stable, tolerating current regimen. Medications refilled. Labs pending as above.   Hyperlipidemia, unspecified hyperlipidemia type - Plan: atorvastatin  (LIPITOR) 10 MG tablet, Comprehensive metabolic panel with GFR, Lipid panel  -  Stable, tolerating current regimen. Medications refilled. Labs pending as above.   Prediabetes - Plan: Hemoglobin A1c  - Minimal weight loss, intentional weight loss as above.  Check A1c and adjust plan accordingly.  Meds ordered this encounter  Medications   amLODipine  (NORVASC ) 10 MG tablet    Sig: Take 1 tablet (10 mg total) by mouth daily.    Dispense:  90 tablet    Refill:  2    DX Code Needed  .   atorvastatin  (LIPITOR) 10 MG tablet    Sig: Take 1 tablet (10 mg total) by mouth daily.    Dispense:  90 tablet    Refill:  2   lisinopril -hydrochlorothiazide  (ZESTORETIC ) 20-12.5 MG tablet    Sig: Take 2 tablets by mouth daily.    Dispense:  180 tablet    Refill:  2   metoprolol  succinate (TOPROL -XL) 50 MG 24 hr tablet    Sig: Take 1 tablet (50 mg total) by mouth daily. Take with or immediately following a meal.    Dispense:  90 tablet    Refill:  2    Discontinue 25mg  dose.   Patient Instructions  I will refer you to orthopedics for suspected frozen shoulder. I have referred you to  gastroenterology for follow-up of the positive colorectal cancer screening and need for colonoscopy.  Let me know if you need any assistance with quitting smoking and let me know if you change your mind on lung cancer screening and I am happy to order that testing. If any concerns on labs I will let you know.  No med changes at this time.  Thanks for coming in today and take care.  Adhesive Capsulitis  Adhesive capsulitis, also called frozen shoulder, causes the shoulder to become stiff and painful to move. This condition happens when there is inflammation of the tendons and ligaments that surround the shoulder joint (shoulder capsule). Tendons are tissues that connect muscle to bone. Ligaments are tissues that connect bones to each other. What are the causes? This condition may be caused by: An injury to your shoulder joint. Straining your shoulder. Not moving your shoulder for a period of time. This can happen if your arm was injured or in a sling. In some cases, the cause is not known. What increases the risk? You are more likely to develop this condition if: You are male. You are older than 61 years of age. You have certain other conditions, such as: Diabetes. Thyroid problems. Stroke. You recently had surgery, especially shoulder or neck surgery. What are the signs or symptoms? Symptoms of this condition include: Pain in your shoulder when you move your arm. There may also be pain when parts of your shoulder are touched. The pain may be worse at night or when you are resting. Not being able to move your shoulder normally. Sudden muscle tightening (muscle spasms). How is  this diagnosed? This condition is diagnosed with a physical exam and imaging tests, such as an X-ray or MRI. How is this treated? This condition may be treated with: Treatment of the injury or condition that caused the adhesive capsulitis. Medicines for pain, inflammation, or muscle spasms. Injections of  medicine (steroids) into the shoulder joint. Physical therapy. This involves doing exercises to get the shoulder moving again. Shoulder manipulation. This is a procedure to move the shoulder into another position. It is done after you are given a medicine to make you fall asleep (general anesthesia). The joint may also be injected with salt water at high pressure to break down scarring. Surgery. This may be done in severe cases when other treatments do not work. Some less common treatments include: Injection of hyaluronic acid into the shoulder joint. This substance is a normal part of the fluid inside joints. It helps lubricate the joint and can lower inflammation. Injection of platelet-rich plasma into the shoulder joint. This uses a type of blood cell from your own blood that may speed up the healing process. Sending energy waves to the affected area (extracorporeal shock wave therapy). Most people recover completely from adhesive capsulitis, but some may not get back full shoulder movement. Follow these instructions at home: Managing pain, stiffness, and swelling     If told, put ice on the injured area. Put ice in a plastic bag. Place a towel between your skin and the bag. Leave the ice on for 20 minutes, 2-3 times a day. If told, apply heat to the affected area before you exercise. Use the heat source that your health care provider recommends, such as a moist heat pack or a heating pad. Place a towel between your skin and the heat source. Leave the heat on for 20-30 minutes. If your skin turns bright red, remove the ice or heat right away to prevent skin damage. The risk of damage is higher if you cannot feel pain, heat, or cold. General instructions Take over-the-counter and prescription medicines only as told by your provider. If you are being treated with physical therapy, do exercises as told. Avoid activities or exercises that put a lot of demand on your shoulder, such as  throwing. These can make pain worse. Contact a health care provider if: You have new symptoms. Your symptoms get worse. This information is not intended to replace advice given to you by your health care provider. Make sure you discuss any questions you have with your health care provider. Document Revised: 03/18/2022 Document Reviewed: 03/18/2022 Elsevier Patient Education  2024 [employer]    Signed,   Reyes Pines, MD [facility], [facility] 05/07/24 9:35 AM

## 2024-05-11 ENCOUNTER — Ambulatory Visit: Payer: Self-pay | Admitting: Family Medicine

## 2024-05-17 NOTE — Progress Notes (Signed)
 Lab results have been discussed.   Verbalized understanding? Yes  Are there any questions? No

## 2024-05-19 ENCOUNTER — Ambulatory Visit: Admitting: Family Medicine

## 2024-11-12 ENCOUNTER — Encounter: Admitting: Family Medicine
